# Patient Record
Sex: Female | Born: 1985 | Race: Black or African American | Hispanic: No | Marital: Single | Smoking: Never smoker
Health system: Southern US, Community
[De-identification: ages and names within clinical notes are randomized; demographics above are authoritative.]

---
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
## 2005-01-30 ENCOUNTER — Other Ambulatory Visit: Admission: RE | Admit: 2005-01-30 | Discharge: 2005-01-30 | Payer: Self-pay | Admitting: Internal Medicine

## 2007-08-19 ENCOUNTER — Emergency Department (HOSPITAL_COMMUNITY): Admission: EM | Admit: 2007-08-19 | Discharge: 2007-08-19 | Payer: Self-pay | Admitting: Emergency Medicine

## 2011-08-16 LAB — URINE MICROSCOPIC-ADD ON

## 2011-08-16 LAB — URINALYSIS, ROUTINE W REFLEX MICROSCOPIC
Bilirubin Urine: NEGATIVE
Glucose, UA: NEGATIVE
Hgb urine dipstick: NEGATIVE
Ketones, ur: NEGATIVE
pH: 7

## 2011-08-16 LAB — PREGNANCY, URINE: Preg Test, Ur: NEGATIVE

## 2015-08-18 ENCOUNTER — Emergency Department (HOSPITAL_BASED_OUTPATIENT_CLINIC_OR_DEPARTMENT_OTHER)
Admission: EM | Admit: 2015-08-18 | Discharge: 2015-08-18 | Disposition: A | Discharge status: Home / Self Care | Payer: No Typology Code available for payment source | Attending: Emergency Medicine | Admitting: Emergency Medicine

## 2015-08-18 ENCOUNTER — Emergency Department (HOSPITAL_BASED_OUTPATIENT_CLINIC_OR_DEPARTMENT_OTHER): Payer: No Typology Code available for payment source

## 2015-08-18 ENCOUNTER — Encounter (HOSPITAL_BASED_OUTPATIENT_CLINIC_OR_DEPARTMENT_OTHER): Payer: Self-pay | Admitting: *Deleted

## 2015-08-18 DIAGNOSIS — R51 Headache: Secondary | ICD-10-CM | POA: Diagnosis not present

## 2015-08-18 DIAGNOSIS — R519 Headache, unspecified: Secondary | ICD-10-CM

## 2015-08-18 MED ORDER — KETOROLAC TROMETHAMINE 60 MG/2ML IM SOLN
60.0000 mg | Freq: Once | INTRAMUSCULAR | Status: AC
  Administered 2015-08-18: 60 mg via INTRAMUSCULAR
  Filled 2015-08-18: qty 2

## 2015-08-18 NOTE — ED Provider Notes (Signed)
CSN: [id number]     Arrival date & time 08/18/15  0344 History   First MD Initiated Contact with Patient 08/18/15 0355     Chief Complaint  Patient presents with  . Headache     (Consider location/radiation/quality/duration/timing/severity/associated sxs/prior Treatment) HPI  This is a 29 year old female with a 6 week history of headaches. The headaches were initially mild and bilateral and resolved with ibuprofen or Aleve. That was some associated twitching of the left eye which is resolved. For the past 3 weeks the headaches have become more severe. They typically begin in the morning and last about 8 hours. They have usually resolved by bedtime but tonight the headache has persisted and is preventing her from sleeping. She rates the pain as severe. For the past several weeks the pain has been localized to the right side of the head, primarily behind the right eye. She denies any further eye twitching. She denies photophobia, blurred vision, numbness, weakness, nausea or vomiting.  History reviewed. No pertinent past medical history. History reviewed. No pertinent past surgical history. History reviewed. No pertinent family history. Social History  Substance Use Topics  . Smoking status: Never Smoker   . Smokeless tobacco: None  . Alcohol Use: No   OB History    No data available     Review of Systems  All other systems reviewed and are negative.   Allergies  Review of patient's allergies indicates no known allergies.  Home Medications   Prior to Admission medications   Not on File   BP 126/79 mmHg  Pulse 71  Temp(Src) 97.9 F (36.6 C) (Oral)  SpO2 100%  LMP 08/18/2015 (LMP Unknown)   Physical Exam  General: Well-developed, well-nourished female in no acute distress; appearance consistent with age of record HENT: normocephalic; atraumatic; scalp nontender Eyes: pupils equal, round and reactive to light; extraocular muscles intact Neck: supple Heart: regular rate  and rhythm Lungs: clear to auscultation bilaterally Abdomen: soft; nondistended; nontender Extremities: No deformity; full range of motion Neurologic: Awake, alert and oriented; motor function intact in all extremities and symmetric; no facial droop; normal coordination and speech; negative Romberg; normal finger to nose; normal gait Skin: Warm and dry Psychiatric: Normal mood and affect    ED Course  Procedures (including critical care time)   MDM  Nursing notes and vitals signs, including pulse oximetry, reviewed.  Summary of this visit's results, reviewed by myself:  Imaging Studies: Ct Head Wo Contrast  08/18/2015  CLINICAL DATA:  Increasing right frontal headaches for 3 weeks. No head injury. EXAM: CT HEAD WITHOUT CONTRAST TECHNIQUE: Contiguous axial images were obtained from the base of the skull through the vertex without intravenous contrast. COMPARISON:  None. FINDINGS: Ventricles and sulci appear symmetrical. No mass effect or midline shift. No abnormal extra-axial fluid collections. Gray-white matter junctions are distinct. Basal cisterns are not effaced. No evidence of acute intracranial hemorrhage. No depressed skull fractures. Visualized paranasal sinuses and mastoid air cells are not opacified. IMPRESSION: No acute intracranial abnormalities. Electronically Signed   By: Burman NievesWilliam  Stevens M.D.   On: 08/18/2015 04:32     5:02 AM Significant relief with IM Toradol.    Paula LibraJohn Emmakate Hypes, MD 08/18/15 [phone]

## 2015-08-18 NOTE — ED Notes (Signed)
Pt with intermittent HA x 6 weeks progressively worsening to the point that it has been continuous x 3 weeks.

## 2021-04-06 ENCOUNTER — Encounter (HOSPITAL_BASED_OUTPATIENT_CLINIC_OR_DEPARTMENT_OTHER): Payer: Self-pay | Admitting: Emergency Medicine

## 2021-04-06 ENCOUNTER — Emergency Department (HOSPITAL_BASED_OUTPATIENT_CLINIC_OR_DEPARTMENT_OTHER): Payer: 59

## 2021-04-06 ENCOUNTER — Other Ambulatory Visit: Payer: Self-pay

## 2021-04-06 ENCOUNTER — Emergency Department (HOSPITAL_BASED_OUTPATIENT_CLINIC_OR_DEPARTMENT_OTHER)
Admission: EM | Admit: 2021-04-06 | Discharge: 2021-04-06 | Disposition: A | Discharge status: Home / Self Care | Payer: 59 | Attending: Emergency Medicine | Admitting: Emergency Medicine

## 2021-04-06 DIAGNOSIS — Z20822 Contact with and (suspected) exposure to covid-19: Secondary | ICD-10-CM | POA: Insufficient documentation

## 2021-04-06 DIAGNOSIS — J189 Pneumonia, unspecified organism: Secondary | ICD-10-CM | POA: Insufficient documentation

## 2021-04-06 DIAGNOSIS — R0602 Shortness of breath: Secondary | ICD-10-CM | POA: Diagnosis present

## 2021-04-06 LAB — BRAIN NATRIURETIC PEPTIDE: B Natriuretic Peptide: 5.3 pg/mL (ref 0.0–100.0)

## 2021-04-06 LAB — COMPREHENSIVE METABOLIC PANEL
ALT: 129 U/L — ABNORMAL HIGH (ref 0–44)
AST: 80 U/L — ABNORMAL HIGH (ref 15–41)
Albumin: 3.6 g/dL (ref 3.5–5.0)
Alkaline Phosphatase: 71 U/L (ref 38–126)
Anion gap: 8 (ref 5–15)
BUN: 10 mg/dL (ref 6–20)
CO2: 26 mmol/L (ref 22–32)
Calcium: 8.7 mg/dL — ABNORMAL LOW (ref 8.9–10.3)
Chloride: 104 mmol/L (ref 98–111)
Creatinine, Ser: 0.73 mg/dL (ref 0.44–1.00)
GFR, Estimated: >60 mL/min (ref >60)
Glucose, Bld: 109 mg/dL — ABNORMAL HIGH (ref 70–99)
Potassium: 4 mmol/L (ref 3.5–5.1)
Sodium: 138 mmol/L (ref 135–145)
Total Bilirubin: 0.4 mg/dL (ref 0.3–1.2)
Total Protein: 7.6 g/dL (ref 6.5–8.1)

## 2021-04-06 LAB — D-DIMER, QUANTITATIVE: D-Dimer, Quant: 1.66 ug/mL-FEU — ABNORMAL HIGH (ref 0.00–0.50)

## 2021-04-06 LAB — RESP PANEL BY RT-PCR (FLU A&B, COVID) ARPGX2
Influenza A by PCR: NEGATIVE
Influenza B by PCR: NEGATIVE
SARS Coronavirus 2 by RT PCR: NEGATIVE

## 2021-04-06 LAB — TROPONIN I (HIGH SENSITIVITY): Troponin I (High Sensitivity): <2 ng/L (ref <18)

## 2021-04-06 LAB — PREGNANCY, URINE: Preg Test, Ur: NEGATIVE

## 2021-04-06 MED ORDER — AZITHROMYCIN 250 MG PO TABS: 250.0000 mg | ORAL_TABLET | Freq: Every day | ORAL | 0 refills | Status: AC

## 2021-04-06 MED ORDER — AZITHROMYCIN 250 MG PO TABS
500.0000 mg | ORAL_TABLET | Freq: Once | ORAL | Status: AC
  Administered 2021-04-06: 500 mg via ORAL
  Filled 2021-04-06: qty 2

## 2021-04-06 MED ORDER — IOHEXOL 350 MG/ML SOLN
100.0000 mL | Freq: Once | INTRAVENOUS | Status: AC | PRN
  Administered 2021-04-06: 75 mL via INTRAVENOUS

## 2021-04-06 MED ORDER — AMOXICILLIN-POT CLAVULANATE 875-125 MG PO TABS: 1.0000 | ORAL_TABLET | Freq: Two times a day (BID) | ORAL | 0 refills | Status: DC

## 2021-04-06 MED ORDER — ALBUTEROL SULFATE HFA 108 (90 BASE) MCG/ACT IN AERS
4.0000 | INHALATION_SPRAY | Freq: Once | RESPIRATORY_TRACT | Status: AC
  Administered 2021-04-06: 4 via RESPIRATORY_TRACT
  Filled 2021-04-06: qty 6.7

## 2021-04-06 MED ORDER — SODIUM CHLORIDE 0.9 % IV SOLN
2.0000 g | Freq: Once | INTRAVENOUS | Status: AC
  Administered 2021-04-06: 2 g via INTRAVENOUS
  Filled 2021-04-06: qty 20

## 2021-04-06 MED ORDER — IPRATROPIUM-ALBUTEROL 0.5-2.5 (3) MG/3ML IN SOLN
3.0000 mL | Freq: Once | RESPIRATORY_TRACT | Status: AC
  Administered 2021-04-06: 3 mL via RESPIRATORY_TRACT
  Filled 2021-04-06: qty 3

## 2021-04-06 MED ORDER — SODIUM CHLORIDE 0.9 % IV SOLN: INTRAVENOUS | Status: DC | PRN

## 2021-04-06 NOTE — ED Notes (Addendum)
Ambulated on r/a SpO2 96-100%, HR 105-110, RR 26-32. Endorses mild DOE.

## 2021-04-06 NOTE — ED Triage Notes (Signed)
Patient complains of shortness of breath x 1 week with worsening this am. Feels that she cannot get a full breath and is coughing.

## 2021-04-06 NOTE — ED Provider Notes (Signed)
[facility] EMERGENCY DEPARTMENT Provider Note   CSN: [id number] Arrival date & time: 04/06/21  0746     History No chief complaint on file.   Natasha Hardy is a 35 y.o. female.  35 year old female who presents with shortness of breath.  Patient states that she has had intermittent shortness of breath for the past 1 week that was initially when she was walking around or walking upstairs but this morning was present at rest.  She occasionally coughs when she tries to take a deep breath then and feels like she cannot take a full deep breath in but she denies persistent cough.  She had a viral URI about a month ago that completely resolved.  No fevers or current cold symptoms.  1 to 2 months ago, she was having swelling in her hands and feet and saw her PCP who referred her to a podiatrist and also to a rheumatologist.  She has not seen the rheumatologist yet but lab work was sent off including testing for lupus which was reportedly negative.  She denies any chest pain, orthopnea, PND, or leg swelling.  She had a long car ride yesterday but symptoms started prior to this.  She denies any history of blood clots, estrogen use, or family history of blood clots.  The history is provided by the patient.       History reviewed. No pertinent past medical history.  There are no problems to display for this patient.   History reviewed. No pertinent surgical history.   OB History   No obstetric history on file.     History reviewed. No pertinent family history.  Social History   Tobacco Use  . Smoking status: Never Smoker  . Smokeless tobacco: Never Used  Substance Use Topics  . Alcohol use: No  . Drug use: No    Home Medications Prior to Admission medications   Medication Sig Start Date End Date Taking? Authorizing Provider  amoxicillin-clavulanate (AUGMENTIN) 875-125 MG tablet Take 1 tablet by mouth every 12 (twelve) hours. 04/06/21  Yes Etan Vasudevan, Ambrose Finland, MD   azithromycin (ZITHROMAX) 250 MG tablet Take 1 tablet (250 mg total) by mouth daily for 4 days. 04/07/21 04/11/21 Yes Zyra Parrillo, Ambrose Finland, MD    Allergies    Patient has no known allergies.  Review of Systems   Review of Systems All other systems reviewed and are negative except that which was mentioned in HPI  Physical Exam Updated Vital Signs BP 104/80   Pulse 96   Temp 99 F (37.2 C) (Oral)   Resp (!) 21   Ht 5\' 2"  (1.575 m)   Wt 80.7 kg   LMP 04/03/2021 (Approximate)   SpO2 98%   BMI 32.56 kg/m   Physical Exam Constitutional:      General: She is not in acute distress.    Appearance: Normal appearance.  HENT:     Head: Normocephalic and atraumatic.  Eyes:     Conjunctiva/sclera: Conjunctivae normal.  Cardiovascular:     Rate and Rhythm: Normal rate and regular rhythm.     Heart sounds: Normal heart sounds. No murmur heard.   Pulmonary:     Effort: Pulmonary effort is normal.     Comments: Severely diminished BS b/l Abdominal:     General: Abdomen is flat. Bowel sounds are normal. There is no distension.     Palpations: Abdomen is soft.     Tenderness: There is no abdominal tenderness.  Musculoskeletal:     Right  lower leg: No edema.     Left lower leg: No edema.  Skin:    General: Skin is warm and dry.  Neurological:     Mental Status: She is alert and oriented to person, place, and time.     Comments: fluent  Psychiatric:        Mood and Affect: Mood normal.        Behavior: Behavior normal.     ED Results / Procedures / Treatments   Labs (all labs ordered are listed, but only abnormal results are displayed) Labs Reviewed  COMPREHENSIVE METABOLIC PANEL - Abnormal; Notable for the following components:      Result Value   Glucose, Bld 109 (*)    Calcium 8.7 (*)    AST 80 (*)    ALT 129 (*)    All other components within normal limits  D-DIMER, QUANTITATIVE (NOT AT [facility]) - Abnormal; Notable for the following components:   D-Dimer, Quant 1.66 (*)     All other components within normal limits  RESP PANEL BY RT-PCR (FLU A&B, COVID) ARPGX2  PREGNANCY, URINE  BRAIN NATRIURETIC PEPTIDE  TROPONIN I (HIGH SENSITIVITY)    EKG EKG Interpretation  Date/Time:  Thursday April 06 2021 08:00:05 EDT Ventricular Rate:  97 PR Interval:  109 QRS Duration: 75 QT Interval:  342 QTC Calculation: 435 R Axis:   67 Text Interpretation: Sinus rhythm Short PR interval Borderline T abnormalities, inferior leads No previous ECGs available Confirmed by Frederick Peers [phone]) on 04/06/2021 8:07:25 AM   Radiology DG Chest 2 View  Result Date: 04/06/2021 CLINICAL DATA:  Shortness of breath. EXAM: CHEST - 2 VIEW COMPARISON:  None. FINDINGS: Left basilar airspace opacities. Suspected small l bilateral pleural effusions. No discernible pneumothorax. Cardiomediastinal silhouette is within normal limits. No evidence of acute osseous abnormality. IMPRESSION: 1. Left basilar airspace opacities, concerning for pneumonia. 2. Suspected small bilateral pleural effusions. Electronically Signed   By: Feliberto Harts MD   On: 04/06/2021 08:48   CT Angio Chest PE W/Cm &/Or Wo Cm  Result Date: 04/06/2021 CLINICAL DATA:  35 year old female with positive D-dimer, possible pulmonary embolus EXAM: CT ANGIOGRAPHY CHEST WITH CONTRAST TECHNIQUE: Multidetector CT imaging of the chest was performed using the standard protocol during bolus administration of intravenous contrast. Multiplanar CT image reconstructions and MIPs were obtained to evaluate the vascular anatomy. CONTRAST:  7mL OMNIPAQUE IOHEXOL 350 MG/ML SOLN COMPARISON:  CTA chest 06/12/2017 FINDINGS: Cardiovascular: Satisfactory opacification of the pulmonary arteries to the segmental level. No evidence of pulmonary embolism. Normal heart size. No pericardial effusion. The right brachiocephalic and left common carotid arteries share a common origin. The left vertebral artery arises directly from the aorta. Mediastinum/Nodes:  Mildly prominent left hilar and prevascular station lymph nodes. The nodes remain within normal limits for size and are highly likely reactive. Lungs/Pleura: Multifocal regions of peripheral pleural based patchy airspace opacities involving both upper lungs, lower lungs and the inferior lingula. Consolidative changes are most conspicuous in the lower lungs. No pneumothorax or pleural effusion. No pulmonary edema. Upper Abdomen: No acute abnormality. Musculoskeletal: No acute fracture or aggressive appearing lytic or blastic osseous lesion. Review of the MIP images confirms the above findings. IMPRESSION: 1. Multifocal patchy airspace opacities in a predominantly peripheral and basilar distribution affecting both upper and lower lobes with relative sparing of the middle lobe. Differential considerations include multifocal pneumonia as well as an atypical/viral respiratory infection including COVID pneumonia. 2. Mildly prominent left hilar and mediastinal lymph nodes are  likely reactive in nature. Electronically Signed   By: Malachy Moan M.D.   On: 04/06/2021 11:09    Procedures Procedures   Medications Ordered in ED Medications  0.9 %  sodium chloride infusion (0 mLs Intravenous Stopped 04/06/21 1221)  ipratropium-albuterol (DUONEB) 0.5-2.5 (3) MG/3ML nebulizer solution 3 mL (3 mLs Nebulization Given 04/06/21 0823)  iohexol (OMNIPAQUE) 350 MG/ML injection 100 mL (75 mLs Intravenous Contrast Given 04/06/21 1044)  cefTRIAXone (ROCEPHIN) 2 g in sodium chloride 0.9 % 100 mL IVPB (0 g Intravenous Stopped 04/06/21 1221)  azithromycin (ZITHROMAX) tablet 500 mg (500 mg Oral Given 04/06/21 1130)  albuterol (VENTOLIN HFA) 108 (90 Base) MCG/ACT inhaler 4 puff (4 puffs Inhalation Given 04/06/21 1228)    ED Course  I have reviewed the triage vital signs and the nursing notes.  Pertinent labs & imaging results that were available during my care of the patient were reviewed by me and considered in my medical decision  making (see chart for details).    MDM Rules/Calculators/A&P                          Patient was severely diminished on exam but no obvious wheezing, O2 saturation 95% on room air, borderline tachycardic.  EKG shows sinus rhythm, no ischemic changes.  Differential includes bronchitis/asthma, PE, post viral pneumonia, or less likely heart failure.  She does not appear volume overloaded on exam.  Gave trial of DuoNeb which patient reports significantly improved her breathing. She is moving better air on reassessment, still no wheezing present. Will provide w/ inhaler.  Lab work notable for BNP normal, negative troponin, AST 80, ALT 129, D-dimer 1.66.  Chest x-ray with small bilateral pleural effusions and basilar opacities suggestive of pneumonia.  Because of elevated D-dimer and shortness of breath, obtain CTA to rule out PE.  CTA is negative for PE but does show multifocal pneumonia.  Her COVID-19 test was negative.  It is possible that she has developed a post viral pneumonia from previous URI illness a month ago.  Will treat with antibiotics.  She was able to ambulate here and maintain O2 saturations at 96% without severe dyspnea.  She has a pulse ox to use at home and discussed how to monitor symptoms.  Instructed to follow-up with PCP in a few days for reassessment and to have repeat chest x-ray in 3 to 4 weeks to ensure resolution.  I have extensively reviewed return precautions and she voiced understanding. Final Clinical Impression(s) / ED Diagnoses Final diagnoses:  Community acquired pneumonia, unspecified laterality    Rx / DC Orders ED Discharge Orders         Ordered    azithromycin (ZITHROMAX) 250 MG tablet  Daily        04/06/21 1241    amoxicillin-clavulanate (AUGMENTIN) 875-125 MG tablet  Every 12 hours        04/06/21 1241           [place] Kirchner, Ambrose Finland, MD 04/06/21 1531

## 2021-04-15 ENCOUNTER — Other Ambulatory Visit: Payer: Self-pay

## 2021-04-15 ENCOUNTER — Encounter (HOSPITAL_BASED_OUTPATIENT_CLINIC_OR_DEPARTMENT_OTHER): Payer: Self-pay | Admitting: Emergency Medicine

## 2021-04-15 ENCOUNTER — Emergency Department (HOSPITAL_BASED_OUTPATIENT_CLINIC_OR_DEPARTMENT_OTHER)
Admission: EM | Admit: 2021-04-15 | Discharge: 2021-04-16 | Disposition: A | Discharge status: Home / Self Care | Payer: 59 | Attending: Emergency Medicine | Admitting: Emergency Medicine

## 2021-04-15 DIAGNOSIS — R918 Other nonspecific abnormal finding of lung field: Secondary | ICD-10-CM | POA: Insufficient documentation

## 2021-04-15 DIAGNOSIS — R0602 Shortness of breath: Secondary | ICD-10-CM | POA: Diagnosis not present

## 2021-04-15 NOTE — ED Triage Notes (Signed)
Recently diagnosed with pneumonia.  Finished antibiotics two days ago.  Reports she's continuing to struggle to breathe and her chest feels achy.

## 2021-04-16 ENCOUNTER — Emergency Department (HOSPITAL_BASED_OUTPATIENT_CLINIC_OR_DEPARTMENT_OTHER): Payer: 59

## 2021-04-16 LAB — CBC WITH DIFFERENTIAL/PLATELET
Abs Immature Granulocytes: 0.02 10*3/uL (ref 0.00–0.07)
Basophils Absolute: 0 10*3/uL (ref 0.0–0.1)
Basophils Relative: 1 %
Eosinophils Absolute: 0 10*3/uL (ref 0.0–0.5)
Eosinophils Relative: 1 %
HCT: 37 % (ref 36.0–46.0)
Hemoglobin: 12.2 g/dL (ref 12.0–15.0)
Immature Granulocytes: 1 %
Lymphocytes Relative: 26 %
Lymphs Abs: 1 10*3/uL (ref 0.7–4.0)
MCH: 29.6 pg (ref 26.0–34.0)
MCHC: 33 g/dL (ref 30.0–36.0)
MCV: 89.8 fL (ref 80.0–100.0)
Monocytes Absolute: 0.3 10*3/uL (ref 0.1–1.0)
Monocytes Relative: 9 %
Neutro Abs: 2.6 10*3/uL (ref 1.7–7.7)
Neutrophils Relative %: 62 %
Platelets: 256 10*3/uL (ref 150–400)
RBC: 4.12 MIL/uL (ref 3.87–5.11)
RDW: 12.5 % (ref 11.5–15.5)
WBC: 4 10*3/uL (ref 4.0–10.5)
nRBC: 0 % (ref 0.0–0.2)

## 2021-04-16 LAB — URINALYSIS, ROUTINE W REFLEX MICROSCOPIC
Bilirubin Urine: NEGATIVE
Glucose, UA: NEGATIVE mg/dL
Hgb urine dipstick: NEGATIVE
Ketones, ur: NEGATIVE mg/dL
Leukocytes,Ua: NEGATIVE
Nitrite: NEGATIVE
Protein, ur: NEGATIVE mg/dL
Specific Gravity, Urine: 1.015 (ref 1.005–1.030)
pH: 7 (ref 5.0–8.0)

## 2021-04-16 LAB — COMPREHENSIVE METABOLIC PANEL
ALT: 61 U/L — ABNORMAL HIGH (ref 0–44)
AST: 43 U/L — ABNORMAL HIGH (ref 15–41)
Albumin: 3.5 g/dL (ref 3.5–5.0)
Alkaline Phosphatase: 74 U/L (ref 38–126)
Anion gap: 7 (ref 5–15)
BUN: 8 mg/dL (ref 6–20)
CO2: 27 mmol/L (ref 22–32)
Calcium: 8.7 mg/dL — ABNORMAL LOW (ref 8.9–10.3)
Chloride: 103 mmol/L (ref 98–111)
Creatinine, Ser: 0.61 mg/dL (ref 0.44–1.00)
GFR, Estimated: >60 mL/min (ref >60)
Glucose, Bld: 100 mg/dL — ABNORMAL HIGH (ref 70–99)
Potassium: 3.9 mmol/L (ref 3.5–5.1)
Sodium: 137 mmol/L (ref 135–145)
Total Bilirubin: 0.3 mg/dL (ref 0.3–1.2)
Total Protein: 7.5 g/dL (ref 6.5–8.1)

## 2021-04-16 MED ORDER — DOXYCYCLINE HYCLATE 100 MG PO TABS
100.0000 mg | ORAL_TABLET | Freq: Once | ORAL | Status: AC
  Administered 2021-04-16: 100 mg via ORAL
  Filled 2021-04-16: qty 1

## 2021-04-16 MED ORDER — DOXYCYCLINE HYCLATE 100 MG PO CAPS: 100.0000 mg | ORAL_CAPSULE | Freq: Two times a day (BID) | ORAL | 0 refills | Status: DC

## 2021-04-16 MED ORDER — ACETAMINOPHEN 500 MG PO TABS
1000.0000 mg | ORAL_TABLET | Freq: Once | ORAL | Status: AC
  Administered 2021-04-16: 1000 mg via ORAL
  Filled 2021-04-16: qty 2

## 2021-04-16 MED ORDER — SODIUM CHLORIDE 0.9 % IV BOLUS
1000.0000 mL | Freq: Once | INTRAVENOUS | Status: AC
  Administered 2021-04-16: 1000 mL via INTRAVENOUS

## 2021-04-16 NOTE — ED Notes (Signed)
Pt was ambulated SpO2 96-98

## 2021-04-16 NOTE — ED Provider Notes (Signed)
[facility] EMERGENCY DEPARTMENT Provider Note  CSN: [id number] Arrival date & time: 04/15/21 2315  Chief Complaint(s) Shortness of Breath  HPI Natasha Hardy is a 35 y.o. female who was diagnosed with multifocal pneumonia on June 2 and has completed her course of Augmentin and azithromycin here for recurrent shortness of breath. Patient reports feeling better while on antibiotics up until 2 days ago when she completed her antibiotic course. She then began having shortness of breath again with pleurisy. Mild cough. No fevers or chills. No nausea or vomiting. No abdominal pain  Symptoms started after a trip to [employer]. No wilderness activities. No tick or insect bites. No exposure to rat/mice or bird feces.   Has been dealing with swollen hands and feet. Following Rheum for work up.  Shortness of Breath  Past Medical History History reviewed. No pertinent past medical history. There are no problems to display for this patient.  Home Medication(s) Prior to Admission medications   Medication Sig Start Date End Date Taking? Authorizing Provider  doxycycline (VIBRAMYCIN) 100 MG capsule Take 1 capsule (100 mg total) by mouth 2 (two) times daily. 04/16/21  Yes Ilee Randleman, Amadeo Garnet, MD                                                                                                                                    Past Surgical History History reviewed. No pertinent surgical history. Family History No family history on file.  Social History Social History   Tobacco Use   Smoking status: Never   Smokeless tobacco: Never  Substance Use Topics   Alcohol use: No   Drug use: No   Allergies Patient has no known allergies.  Review of Systems Review of Systems  Respiratory:  Positive for shortness of breath.   All other systems are reviewed and are negative for acute change except as noted in the HPI  Physical Exam Vital Signs  I have reviewed the triage  vital signs BP (!) 99/49 (BP Location: Right Arm)   Pulse (!) 103   Temp 98.7 F (37.1 C) (Oral)   Resp 20   Ht 5\' 2"  (1.575 m)   Wt 80.7 kg   LMP 04/03/2021 (Approximate)   SpO2 98%   BMI 32.56 kg/m   Physical Exam Vitals reviewed.  Constitutional:      General: She is not in acute distress.    Appearance: She is well-developed. She is not diaphoretic.  HENT:     Head: Normocephalic and atraumatic.     Nose: Nose normal.  Eyes:     General: No scleral icterus.       Right eye: No discharge.        Left eye: No discharge.     Conjunctiva/sclera: Conjunctivae normal.     Pupils: Pupils are equal, round, and reactive to light.  Cardiovascular:     Rate and Rhythm: Normal rate and regular  rhythm.     Heart sounds: No murmur heard.   No friction rub. No gallop.  Pulmonary:     Effort: Pulmonary effort is normal. No respiratory distress.     Breath sounds: No stridor. Examination of the right-middle field reveals rales. Examination of the left-middle field reveals rales. Examination of the right-lower field reveals rales. Examination of the left-lower field reveals rales. Rales present.     Comments: Poor inhalation effort. Abdominal:     General: There is no distension.     Palpations: Abdomen is soft.     Tenderness: There is no abdominal tenderness.  Musculoskeletal:        General: No tenderness.     Cervical back: Normal range of motion and neck supple.  Skin:    General: Skin is warm and dry.     Findings: No erythema or rash.  Neurological:     Mental Status: She is alert and oriented to person, place, and time.    ED Results and Treatments Labs (all labs ordered are listed, but only abnormal results are displayed) Labs Reviewed  COMPREHENSIVE METABOLIC PANEL - Abnormal; Notable for the following components:      Result Value   Glucose, Bld 100 (*)    Calcium 8.7 (*)    AST 43 (*)    ALT 61 (*)    All other components within normal limits  CBC WITH  DIFFERENTIAL/PLATELET  URINALYSIS, ROUTINE W REFLEX MICROSCOPIC  ANGIOTENSIN CONVERTING ENZYME                                                                                                                         EKG  EKG Interpretation  Date/Time:  Saturday April 15 2021 23:29:53 EDT Ventricular Rate:  98 PR Interval:  118 QRS Duration: 76 QT Interval:  343 QTC Calculation: 438 R Axis:   76 Text Interpretation: Sinus rhythm Borderline short PR interval No acute changes Confirmed by Drema Pry [phone]) on 04/16/2021 12:46:51 AM        Radiology CT Chest Wo Contrast  Result Date: 04/16/2021 CLINICAL DATA:  Respiratory illness, nondiagnostic radiograph, shortness of breath for 1 week, worsening this morning EXAM: CT CHEST WITHOUT CONTRAST TECHNIQUE: Multidetector CT imaging of the chest was performed following the standard protocol without IV contrast. COMPARISON:  CT 04/06/2021, radiograph 04/16/2021 FINDINGS: Cardiovascular: Normal heart size. No pericardial effusion. No acute luminal abnormality of the imaged aorta. No periaortic stranding or hemorrhage. Normal 3 vessel branching of the aortic arch. Proximal great vessels are unremarkable. Central pulmonary arteries are normal caliber. Luminal evaluation of the vasculature precluded in the absence of contrast media. No major venous abnormalities are seen. Mediastinum/Nodes: Prominent prevascular, AP window and bilateral axillary lymph nodes are similar to prior and favored to be reactive. No pathologically enlarged nodes. No acute abnormality of the trachea or esophagus. Normal thyroid gland and thoracic inlet. Lungs/Pleura: Increasing patchy peripheral predominant areas of mixed consolidation and ground-glass opacity throughout both lungs,  left greater than right, with a slight basilar gradient. No pneumothorax or visible pleural effusion. Pulmonary vascularity remains normally distributed. No significant septal thickening or other CT  evidence pulmonary edema. Upper Abdomen: No acute abnormalities present in the visualized portions of the upper abdomen. Musculoskeletal: Multilevel degenerative changes are present in the imaged portions of the spine. No acute osseous abnormality or suspicious osseous lesion. No worrisome chest wall masses or lesions. IMPRESSION: 1. Increasing patchy peripheral predominant areas of mixed consolidation and ground-glass opacity throughout both lungs differential consideration would including multifocal pneumonia as well as other atypical infectious or inflammatory processes including COVID-19 pneumonia. 2. Prominent mediastinal and axillary adenopathy is favored to be reactive. Electronically Signed   By: Kreg Shropshire M.D.   On: 04/16/2021 02:57   DG Chest Port 1 View  Result Date: 04/16/2021 CLINICAL DATA:  Chest discomfort, shortness of breath EXAM: PORTABLE CHEST 1 VIEW COMPARISON:  04/06/2021 FINDINGS: Bibasilar atelectasis. Heart is normal size. No visible effusions. No acute bony abnormality. IMPRESSION: Bibasilar atelectasis. Electronically Signed   By: Charlett Nose M.D.   On: 04/16/2021 01:26    Pertinent labs & imaging results that were available during my care of the patient were reviewed by me and considered in my medical decision making (see chart for details).  Medications Ordered in ED Medications  acetaminophen (TYLENOL) tablet 1,000 mg (1,000 mg Oral Given 04/16/21 0052)  sodium chloride 0.9 % bolus 1,000 mL (0 mLs Intravenous Stopped 04/16/21 0238)  doxycycline (VIBRA-TABS) tablet 100 mg (100 mg Oral Given 04/16/21 0415)                                                                                                                                    Procedures Procedures  (including critical care time)  Medical Decision Making / ED Course I have reviewed the nursing notes for this encounter and the patient's prior records (if available in EHR or on provided paperwork).   Donasia Wimes was evaluated in Emergency Department on 04/16/2021 for the symptoms described in the history of present illness. She was evaluated in the context of the global COVID-19 pandemic, which necessitated consideration that the patient might be at risk for infection with the SARS-CoV-2 virus that causes COVID-19. Institutional protocols and algorithms that pertain to the evaluation of patients at risk for COVID-19 are in a state of rapid change based on information released by regulatory bodies including the CDC and federal and state organizations. These policies and algorithms were followed during the patient's care in the ED.  Work up not totally convincing for persistent CAP, but will continue treatment with Doxy in case. Given her extremity swelling, there's a concern for systemic inflammatory process. ACE sent to assess for Sarcoidosis.  No proteinuria that would be concerning for nephrotic syndrome. No hemoptysis or pulmonary hemorrhage poining to Wegner's or [place].  Patient also provided with incentive spirometer. Ambulated w/o desatting. Appropriate  for continued OP management.     Final Clinical Impression(s) / ED Diagnoses Final diagnoses:  Shortness of breath  Pulmonary infiltrate present on computed tomography   The patient appears reasonably screened and/or stabilized for discharge and I doubt any other medical condition or other [facility] requiring further screening, evaluation, or treatment in the ED at this time prior to discharge. Safe for discharge with strict return precautions.  Disposition: Discharge  Condition: Good  I have discussed the results, Dx and Tx plan with the patient/family who expressed understanding and agree(s) with the plan. Discharge instructions discussed at length. The patient/family was given strict return precautions who verbalized understanding of the instructions. No further questions at time of discharge.    ED Discharge Orders           Ordered    doxycycline (VIBRAMYCIN) 100 MG capsule  2 times daily        04/16/21 0435             Follow Up: Trey SailorsPa, [facility] [address] [place] [address] [phone]  Call  if symptoms do not improve or  worsen, in 5-7 days      This chart was dictated using voice recognition software.  Despite best efforts to proofread,  errors can occur which can change the documentation meaning.    Nira Connardama, Teran Daughenbaugh Eduardo, MD 04/16/21 [phone]

## 2021-04-16 NOTE — ED Notes (Signed)
Wants to consult w/ MD before getting cxr; says she just had one 3 days ago and was told she wouldn't need another for a month "b/c an xray lags behind the healing process."

## 2021-04-17 LAB — ANGIOTENSIN CONVERTING ENZYME: Angiotensin-Converting Enzyme: 41 U/L (ref 14–82)

## 2021-05-21 ENCOUNTER — Encounter (HOSPITAL_COMMUNITY): Payer: Self-pay | Admitting: Specialist

## 2021-05-21 ENCOUNTER — Inpatient Hospital Stay (HOSPITAL_COMMUNITY)
Admission: EM | Admit: 2021-05-21 | Discharge: 2021-05-28 | DRG: 208 | Disposition: A | Discharge status: Other Acute Inpatient Hospital | Payer: 59 | Attending: Internal Medicine | Admitting: Internal Medicine

## 2021-05-21 ENCOUNTER — Emergency Department (HOSPITAL_COMMUNITY): Payer: 59

## 2021-05-21 ENCOUNTER — Other Ambulatory Visit: Payer: Self-pay

## 2021-05-21 DIAGNOSIS — J982 Interstitial emphysema: Secondary | ICD-10-CM | POA: Diagnosis present

## 2021-05-21 DIAGNOSIS — Z20822 Contact with and (suspected) exposure to covid-19: Secondary | ICD-10-CM | POA: Diagnosis present

## 2021-05-21 DIAGNOSIS — J189 Pneumonia, unspecified organism: Secondary | ICD-10-CM | POA: Diagnosis present

## 2021-05-21 DIAGNOSIS — Z6830 Body mass index (BMI) 30.0-30.9, adult: Secondary | ICD-10-CM

## 2021-05-21 DIAGNOSIS — R21 Rash and other nonspecific skin eruption: Secondary | ICD-10-CM | POA: Diagnosis present

## 2021-05-21 DIAGNOSIS — K5900 Constipation, unspecified: Secondary | ICD-10-CM | POA: Diagnosis present

## 2021-05-21 DIAGNOSIS — L810 Postinflammatory hyperpigmentation: Secondary | ICD-10-CM | POA: Diagnosis present

## 2021-05-21 DIAGNOSIS — Q211 Atrial septal defect: Secondary | ICD-10-CM

## 2021-05-21 DIAGNOSIS — J9690 Respiratory failure, unspecified, unspecified whether with hypoxia or hypercapnia: Secondary | ICD-10-CM | POA: Diagnosis present

## 2021-05-21 DIAGNOSIS — J849 Interstitial pulmonary disease, unspecified: Secondary | ICD-10-CM | POA: Diagnosis present

## 2021-05-21 DIAGNOSIS — F064 Anxiety disorder due to known physiological condition: Secondary | ICD-10-CM | POA: Diagnosis present

## 2021-05-21 DIAGNOSIS — E43 Unspecified severe protein-calorie malnutrition: Secondary | ICD-10-CM | POA: Diagnosis present

## 2021-05-21 DIAGNOSIS — T380X5A Adverse effect of glucocorticoids and synthetic analogues, initial encounter: Secondary | ICD-10-CM | POA: Diagnosis present

## 2021-05-21 DIAGNOSIS — Z978 Presence of other specified devices: Secondary | ICD-10-CM

## 2021-05-21 DIAGNOSIS — Z79899 Other long term (current) drug therapy: Secondary | ICD-10-CM | POA: Diagnosis not present

## 2021-05-21 DIAGNOSIS — Z7952 Long term (current) use of systemic steroids: Secondary | ICD-10-CM | POA: Diagnosis not present

## 2021-05-21 DIAGNOSIS — J9601 Acute respiratory failure with hypoxia: Secondary | ICD-10-CM | POA: Diagnosis present

## 2021-05-21 DIAGNOSIS — R55 Syncope and collapse: Secondary | ICD-10-CM | POA: Diagnosis not present

## 2021-05-21 DIAGNOSIS — D849 Immunodeficiency, unspecified: Secondary | ICD-10-CM | POA: Diagnosis not present

## 2021-05-21 DIAGNOSIS — Z8616 Personal history of COVID-19: Secondary | ICD-10-CM

## 2021-05-21 DIAGNOSIS — H5702 Anisocoria: Secondary | ICD-10-CM | POA: Diagnosis present

## 2021-05-21 DIAGNOSIS — J80 Acute respiratory distress syndrome: Secondary | ICD-10-CM | POA: Diagnosis not present

## 2021-05-21 DIAGNOSIS — M0510 Rheumatoid lung disease with rheumatoid arthritis of unspecified site: Secondary | ICD-10-CM | POA: Diagnosis present

## 2021-05-21 DIAGNOSIS — D84821 Immunodeficiency due to drugs: Secondary | ICD-10-CM | POA: Diagnosis not present

## 2021-05-21 DIAGNOSIS — R739 Hyperglycemia, unspecified: Secondary | ICD-10-CM | POA: Diagnosis present

## 2021-05-21 DIAGNOSIS — Z452 Encounter for adjustment and management of vascular access device: Secondary | ICD-10-CM

## 2021-05-21 DIAGNOSIS — F419 Anxiety disorder, unspecified: Secondary | ICD-10-CM | POA: Diagnosis present

## 2021-05-21 DIAGNOSIS — R7401 Elevation of levels of liver transaminase levels: Secondary | ICD-10-CM

## 2021-05-21 LAB — I-STAT ARTERIAL BLOOD GAS, ED
Acid-Base Excess: 1 mmol/L (ref 0.0–2.0)
Bicarbonate: 25.9 mmol/L (ref 20.0–28.0)
Calcium, Ion: 1.31 mmol/L (ref 1.15–1.40)
HCT: 50 % — ABNORMAL HIGH (ref 36.0–46.0)
Hemoglobin: 17 g/dL — ABNORMAL HIGH (ref 12.0–15.0)
O2 Saturation: 92 %
Potassium: 4.1 mmol/L (ref 3.5–5.1)
Sodium: 141 mmol/L (ref 135–145)
TCO2: 27 mmol/L (ref 22–32)
pCO2 arterial: 40.2 mmHg (ref 32.0–48.0)
pH, Arterial: 7.416 (ref 7.350–7.450)
pO2, Arterial: 63 mmHg — ABNORMAL LOW (ref 83.0–108.0)

## 2021-05-21 LAB — COMPREHENSIVE METABOLIC PANEL
ALT: 78 U/L — ABNORMAL HIGH (ref 0–44)
AST: 60 U/L — ABNORMAL HIGH (ref 15–41)
Albumin: 3.2 g/dL — ABNORMAL LOW (ref 3.5–5.0)
Alkaline Phosphatase: 61 U/L (ref 38–126)
Anion gap: 9 (ref 5–15)
BUN: 6 mg/dL (ref 6–20)
CO2: 25 mmol/L (ref 22–32)
Calcium: 9.2 mg/dL (ref 8.9–10.3)
Chloride: 104 mmol/L (ref 98–111)
Creatinine, Ser: 0.57 mg/dL (ref 0.44–1.00)
GFR, Estimated: >60 mL/min (ref >60)
Glucose, Bld: 141 mg/dL — ABNORMAL HIGH (ref 70–99)
Potassium: 4.2 mmol/L (ref 3.5–5.1)
Sodium: 138 mmol/L (ref 135–145)
Total Bilirubin: 0.5 mg/dL (ref 0.3–1.2)
Total Protein: 8 g/dL (ref 6.5–8.1)

## 2021-05-21 LAB — CBC
HCT: 43 % (ref 36.0–46.0)
HCT: 41.4 % (ref 36.0–46.0)
Hemoglobin: 13.7 g/dL (ref 12.0–15.0)
Hemoglobin: 13.8 g/dL (ref 12.0–15.0)
MCH: 28.8 pg (ref 26.0–34.0)
MCH: 29.4 pg (ref 26.0–34.0)
MCHC: 31.9 g/dL (ref 30.0–36.0)
MCHC: 33.3 g/dL (ref 30.0–36.0)
MCV: 90.3 fL (ref 80.0–100.0)
MCV: 88.1 fL (ref 80.0–100.0)
Platelets: 315 10*3/uL (ref 150–400)
Platelets: 307 10*3/uL (ref 150–400)
RBC: 4.76 MIL/uL (ref 3.87–5.11)
RBC: 4.7 MIL/uL (ref 3.87–5.11)
RDW: 12.9 % (ref 11.5–15.5)
RDW: 12.8 % (ref 11.5–15.5)
WBC: 8.7 10*3/uL (ref 4.0–10.5)
WBC: 14 10*3/uL — ABNORMAL HIGH (ref 4.0–10.5)
nRBC: 0 % (ref 0.0–0.2)
nRBC: 0 % (ref 0.0–0.2)

## 2021-05-21 LAB — CBC WITH DIFFERENTIAL/PLATELET
Abs Immature Granulocytes: 0.08 10*3/uL — ABNORMAL HIGH (ref 0.00–0.07)
Basophils Absolute: 0 10*3/uL (ref 0.0–0.1)
Basophils Relative: 0 %
Eosinophils Absolute: 0 10*3/uL (ref 0.0–0.5)
Eosinophils Relative: 0 %
HCT: 43.7 % (ref 36.0–46.0)
Hemoglobin: 14.1 g/dL (ref 12.0–15.0)
Immature Granulocytes: 1 %
Lymphocytes Relative: 10 %
Lymphs Abs: 0.8 10*3/uL (ref 0.7–4.0)
MCH: 28.8 pg (ref 26.0–34.0)
MCHC: 32.3 g/dL (ref 30.0–36.0)
MCV: 89.4 fL (ref 80.0–100.0)
Monocytes Absolute: 0.3 10*3/uL (ref 0.1–1.0)
Monocytes Relative: 5 %
Neutro Abs: 6.2 10*3/uL (ref 1.7–7.7)
Neutrophils Relative %: 84 %
Platelets: 338 10*3/uL (ref 150–400)
RBC: 4.89 MIL/uL (ref 3.87–5.11)
RDW: 13 % (ref 11.5–15.5)
WBC: 7.4 10*3/uL (ref 4.0–10.5)
nRBC: 0 % (ref 0.0–0.2)

## 2021-05-21 LAB — TROPONIN I (HIGH SENSITIVITY)
Troponin I (High Sensitivity): 5 ng/L (ref <18)
Troponin I (High Sensitivity): 5 ng/L (ref <18)

## 2021-05-21 LAB — BRAIN NATRIURETIC PEPTIDE: B Natriuretic Peptide: 6 pg/mL (ref 0.0–100.0)

## 2021-05-21 LAB — GLUCOSE, CAPILLARY: Glucose-Capillary: 137 mg/dL — ABNORMAL HIGH (ref 70–99)

## 2021-05-21 LAB — RESP PANEL BY RT-PCR (FLU A&B, COVID) ARPGX2
Influenza A by PCR: NEGATIVE
Influenza B by PCR: NEGATIVE
SARS Coronavirus 2 by RT PCR: NEGATIVE

## 2021-05-21 LAB — CREATININE, SERUM
Creatinine, Ser: 0.46 mg/dL (ref 0.44–1.00)
GFR, Estimated: >60 mL/min (ref >60)

## 2021-05-21 LAB — HIV ANTIBODY (ROUTINE TESTING W REFLEX): HIV Screen 4th Generation wRfx: NONREACTIVE

## 2021-05-21 MED ORDER — PIPERACILLIN-TAZOBACTAM 3.375 G IVPB
3.3750 g | Freq: Three times a day (TID) | INTRAVENOUS | Status: DC
  Administered 2021-05-21 – 2021-05-23 (×5): 3.375 g via INTRAVENOUS
  Filled 2021-05-21 (×5): qty 50

## 2021-05-21 MED ORDER — DOCUSATE SODIUM 100 MG PO CAPS: 100.0000 mg | ORAL_CAPSULE | Freq: Two times a day (BID) | ORAL | Status: DC | PRN

## 2021-05-21 MED ORDER — METHYLPREDNISOLONE SODIUM SUCC 125 MG IJ SOLR
60.0000 mg | Freq: Four times a day (QID) | INTRAMUSCULAR | Status: DC
  Administered 2021-05-21 – 2021-05-24 (×11): 60 mg via INTRAVENOUS
  Filled 2021-05-21 (×11): qty 2

## 2021-05-21 MED ORDER — METHYLPREDNISOLONE SODIUM SUCC 125 MG IJ SOLR
125.0000 mg | Freq: Once | INTRAMUSCULAR | Status: AC
  Administered 2021-05-21: 125 mg via INTRAVENOUS
  Filled 2021-05-21: qty 2

## 2021-05-21 MED ORDER — METHYLPREDNISOLONE SODIUM SUCC 125 MG IJ SOLR: 80.0000 mg | Freq: Every day | INTRAMUSCULAR | Status: DC

## 2021-05-21 MED ORDER — SODIUM CHLORIDE 0.9 % IV SOLN
500.0000 mg | INTRAVENOUS | Status: DC
  Administered 2021-05-21 – 2021-05-22 (×2): 500 mg via INTRAVENOUS
  Filled 2021-05-21 (×3): qty 500

## 2021-05-21 MED ORDER — ORAL CARE MOUTH RINSE
15.0000 mL | Freq: Two times a day (BID) | OROMUCOSAL | Status: DC
  Administered 2021-05-24 – 2021-05-27 (×3): 15 mL via OROMUCOSAL

## 2021-05-21 MED ORDER — IPRATROPIUM-ALBUTEROL 0.5-2.5 (3) MG/3ML IN SOLN
3.0000 mL | Freq: Once | RESPIRATORY_TRACT | Status: AC
  Administered 2021-05-21: 3 mL via RESPIRATORY_TRACT
  Filled 2021-05-21: qty 3

## 2021-05-21 MED ORDER — POLYETHYLENE GLYCOL 3350 17 G PO PACK: 17.0000 g | PACK | Freq: Every day | ORAL | Status: DC | PRN

## 2021-05-21 MED ORDER — FAMOTIDINE IN NACL 20-0.9 MG/50ML-% IV SOLN
20.0000 mg | Freq: Two times a day (BID) | INTRAVENOUS | Status: DC
  Administered 2021-05-21 – 2021-05-22 (×3): 20 mg via INTRAVENOUS
  Filled 2021-05-21 (×3): qty 50

## 2021-05-21 MED ORDER — CHLORHEXIDINE GLUCONATE CLOTH 2 % EX PADS
6.0000 | MEDICATED_PAD | Freq: Every day | CUTANEOUS | Status: DC
  Administered 2021-05-21 – 2021-05-27 (×6): 6 via TOPICAL

## 2021-05-21 MED ORDER — ALBUTEROL SULFATE (2.5 MG/3ML) 0.083% IN NEBU
2.5000 mg | INHALATION_SOLUTION | RESPIRATORY_TRACT | Status: DC
  Administered 2021-05-21 – 2021-05-22 (×5): 2.5 mg via RESPIRATORY_TRACT
  Filled 2021-05-21 (×4): qty 3

## 2021-05-21 MED ORDER — ENOXAPARIN SODIUM 40 MG/0.4ML IJ SOSY
40.0000 mg | PREFILLED_SYRINGE | INTRAMUSCULAR | Status: DC
  Administered 2021-05-22 – 2021-05-26 (×5): 40 mg via SUBCUTANEOUS
  Filled 2021-05-21 (×5): qty 0.4

## 2021-05-21 MED ORDER — CHLORHEXIDINE GLUCONATE 0.12 % MT SOLN
15.0000 mL | Freq: Two times a day (BID) | OROMUCOSAL | Status: DC
  Administered 2021-05-22 – 2021-05-27 (×10): 15 mL via OROMUCOSAL
  Filled 2021-05-21 (×9): qty 15

## 2021-05-21 MED ORDER — IOHEXOL 350 MG/ML SOLN
100.0000 mL | Freq: Once | INTRAVENOUS | Status: AC | PRN
  Administered 2021-05-21: 100 mL via INTRAVENOUS

## 2021-05-21 MED ORDER — ACETAMINOPHEN 650 MG RE SUPP: 650.0000 mg | Freq: Four times a day (QID) | RECTAL | Status: DC | PRN

## 2021-05-21 MED ORDER — IPRATROPIUM-ALBUTEROL 0.5-2.5 (3) MG/3ML IN SOLN
3.0000 mL | Freq: Four times a day (QID) | RESPIRATORY_TRACT | Status: DC | PRN
  Administered 2021-05-21: 3 mL via RESPIRATORY_TRACT
  Filled 2021-05-21: qty 3

## 2021-05-21 MED ORDER — ONDANSETRON HCL 4 MG/2ML IJ SOLN: 4.0000 mg | Freq: Four times a day (QID) | INTRAMUSCULAR | Status: DC | PRN

## 2021-05-21 MED ORDER — LORAZEPAM 2 MG/ML IJ SOLN
0.5000 mg | Freq: Once | INTRAMUSCULAR | Status: AC
  Administered 2021-05-21: 0.5 mg via INTRAVENOUS
  Filled 2021-05-21: qty 1

## 2021-05-21 MED ORDER — ACETAMINOPHEN 325 MG PO TABS
650.0000 mg | ORAL_TABLET | Freq: Four times a day (QID) | ORAL | Status: DC | PRN
  Administered 2021-05-24 – 2021-05-27 (×4): 650 mg via ORAL
  Filled 2021-05-21 (×4): qty 2

## 2021-05-21 NOTE — Progress Notes (Signed)
Pt transported from ED32 to 2H-24 without event.

## 2021-05-21 NOTE — ED Notes (Signed)
Critical care providers bedside along w/ respiratory speaking to pt. And mother.

## 2021-05-21 NOTE — ED Provider Notes (Signed)
Signout note  35 year old lady with history of recent hospitalization for pneumonia presenting to ER with concern for worsening shortness of breath, low oxygen saturation.  Currently on 6 L nasal cannula, not in distress and alert.  7:05 AM Received signout from Dr. Judd Lien, plan to follow-up on CT scan and admit patient  CT shows extensive pneumomediastinum.  Discussed with pulmonology who will see patient as consult.  They recommended discussing with cardiothoracic surgery.  Discussed with Dr. Cliffton Asters.  Their team will also evaluate patient.  Recommend getting esophagram for completeness sake however this is suspected to be more likely barotrauma from frequent cough and recent respiratory illnesses.  On frequent reassessments, patient condition remains stable.  She is somewhat tachypneic but not in frank distress. However does have significant oxygen requirement, now on high flow nasal cannula. PCCM evaluated patient and feels okay for admission to medicine at present. Consulted unassigned med admit.  CRITICAL CARE Performed by: Milagros Loll   Total critical care time: 38 minutes  Critical care time was exclusive of separately billable procedures and treating other patients.  Critical care was necessary to treat or prevent imminent or life-threatening deterioration.  Critical care was time spent personally by me on the following activities: development of treatment plan with patient and/or surrogate as well as nursing, discussions with consultants, evaluation of patient's response to treatment, examination of patient, obtaining history from patient or surrogate, ordering and performing treatments and interventions, ordering and review of laboratory studies, ordering and review of radiographic studies, pulse oximetry and re-evaluation of patient's condition.    Milagros Loll, MD 05/22/21 1141

## 2021-05-21 NOTE — Consult Note (Signed)
NAME:  Natasha Hardy, MRN:  [id number], DOB:  [birth year], LOS: 0 ADMISSION DATE:  05/21/2021, CONSULTATION DATE: 05/21/2021 REFERRING MD: Dr. Stevie Kern, I M[facility], CHIEF COMPLAINT: Abnormal CT chest, pneumomediastinum  History of Present Illness:  35 year old woman, never smoker, with past medical history.  She began to have edema, joint pain as early as April 2022.  Evaluated for progressive dyspnea June 2 and found to have abnormal CT chest with bilateral patchy groundglass pulmonary infiltrates with a relative peripheral distribution including the apices and both bases which were more consolidated.  Her COVID-19 was negative.  She was treated with antibiotics without significant improvement and was reevaluated 04/16/2021.  Another CT chest showed progression of bilateral patchy groundglass infiltrates again in a peripheral pattern with a slight basilar gradient, associated with prominent mediastinal nodes.  She was again treated unsuccessfully with antibiotics.  She was admitted in [facility] 6/20 through 6/24, repeat CT there again showed persistent patchy peripheral consolidation and groundglass with progression.  Her COVID 19 testing was negative on each occasion.  She received empiric IV antibiotics as well as IV corticosteroids, transitioned to prednisone and tapered.  Presents now with progressive exertional SOB, paroxysmal cough that is mostly non-productive. No N/V or dry heaves.   No eosinophilia on CBC from 04/16/21, repeat pending  Pertinent  Medical History  Gestational diabetes Iron deficiency anemia LE arthralgias and swelling, associated with rash, distal digital petechiae - negative Rheum eval per her report.    Significant Hospital Events: Including procedures, antibiotic start and stop dates in addition to other pertinent events   Multiple CT scans of the chest since 04/06/2021 as an outpatient and then when admitted in [facility] with bilateral pulmonary  infiltrates Echocardiogram (HP) 04/26/21 >> LVEF 65-70%, normal LV size, normal RV size and function  Interim History / Subjective:  Continues have some dyspnea and has had desaturations with coughing, conversation, movement  Objective   Blood pressure 125/89, pulse (!) 114, temperature 98.5 F (36.9 C), temperature source Oral, resp. rate (!) 22, height 5\' 2"  (1.575 m), weight 76.7 kg, SpO2 100 %.       No intake or output data in the 24 hours ending 05/21/21 1315 Filed Weights   05/21/21 0445  Weight: 76.7 kg    Examination: General: Well-developed comfortable woman laying in bed on supplemental oxygen HENT: Oropharynx clear, moist, pupils equal Lungs: Very distant, few bibasilar inspiratory crackles, no wheezes Cardiovascular: Regular, distant, no murmur Abdomen: Nondistended, positive bowel sounds Extremities: No significant edema, no joint swelling in the toes.  Question some slight swelling in the bilateral PIP joints of her hands Neuro: Awake, alert, interacting appropriately, well oriented  Resolved Hospital Problem list     Assessment & Plan:   Acute hypoxemic respiratory failure due to bilateral pulmonary infiltrates.  These have a somewhat peripheral predominance, affect the apices and more so both bases.  Differential diagnosis includes cryptogenic organizing pneumonia (possibly following viral syndrome for her original presentation in early June), eosinophilic granulomatosis or Churg-Strauss, sarcoidosis. Consider autoimmune mediated interstitial infiltrates given her joint discomfort, swelling, rash although initial rheumatology labs were reportedly unrevealing.  Consider also opportunistic or atypical infection.  HIV is pending.  She does not have any significant inhaled exposures, occupational or environmental exposures. -Follow HIV -Plan to resend autoimmune panel -Hypersensitive pneumonitis panel -Angiotensin converting enzyme level -CBC with differential and IgE  to evaluate for possible allergic mediated process, Churg-Strauss -She likely benefit from bronchoscopy with transbronchial biopsies, BAL for cell count  and differential, cytology, culture data.  At this point given her high oxygen need there would be significant risk for respiratory failure, mechanical ventilation which we should try to avoid.  Ultimately in order to get answers we may need to reconsider bronchoscopy or even VATS biopsy with thoracic surgery. -Agree with corticosteroids to empirically treat possible cryptogenic organizing pneumonia (COP). -Low threshold to discontinue her antibiotics.  Could check a procalcitonin to help determine duration.  Pneumomediastinum.  Likely due to paroxysmal hard coughing.  No evidence of pneumothorax on chest CT -No intervention at this time -Agree w esophagram for completeness to ensure no esophageal defect -Follow CXR and clinical status, watching for any evidence of pneumothorax that would require chest tube placement   Labs   CBC: Recent Labs  Lab 05/21/21 0503  WBC 8.7  HGB 13.7  HCT 43.0  MCV 90.3  PLT 315    Basic Metabolic Panel: Recent Labs  Lab 05/21/21 0503  NA 138  K 4.2  CL 104  CO2 25  GLUCOSE 141*  BUN 6  CREATININE 0.57  CALCIUM 9.2   GFR: Estimated Creatinine Clearance: 94.1 mL/min (by C-G formula based on SCr of 0.57 mg/dL). Recent Labs  Lab 05/21/21 0503  WBC 8.7    Liver Function Tests: Recent Labs  Lab 05/21/21 0503  AST 60*  ALT 78*  ALKPHOS 61  BILITOT 0.5  PROT 8.0  ALBUMIN 3.2*   No results for input(s): LIPASE, AMYLASE in the last 168 hours. No results for input(s): AMMONIA in the last 168 hours.  ABG No results found for: PHART, PCO2ART, PO2ART, HCO3, TCO2, ACIDBASEDEF, O2SAT   Coagulation Profile: No results for input(s): INR, PROTIME in the last 168 hours.  Cardiac Enzymes: No results for input(s): CKTOTAL, CKMB, CKMBINDEX, TROPONINI in the last 168 hours.  HbA1C: No results  found for: HGBA1C  CBG: No results for input(s): GLUCAP in the last 168 hours.  Review of Systems:   Dyspnea, cough.  Nausea, diarrhea, tightness and swelling in her lower extremities and joint pain.  No hemoptysis, fever, chills, chest discomfort. She did have a rash prior to her previous hospitalization on her chest.  Past Medical History:  She,  has no past medical history on file.   Surgical History:  No past surgical history on file.   Social History:   reports that she has never smoked. She has never used smokeless tobacco. She reports that she does not drink alcohol and does not use drugs.  No inhaled medications or drugs of any kind prior to this illness.  She has been given albuterol HFA and has used intermittently No occupational inhaled exposures, works in [employer], [employer] No pets/birds, no hot tub/pool, no mold exposure, no travel or [employer] [place] native  Family History:  Diabetes mellitus Breast and ovarian cancer Colon cancer  Allergies No Known Allergies   Home Medications  Prior to Admission medications   Medication Sig Start Date End Date Taking? Authorizing Provider  doxycycline (VIBRAMYCIN) 100 MG capsule Take 1 capsule (100 mg total) by mouth 2 (two) times daily. 04/16/21   Nira Conn, MD     Critical care time: N/A     Levy Pupa, MD, PhD 05/21/2021, 3:06 PM [facility] [phone] or if no answer before 7:00PM call [phone] For any issues after 7:00PM please call eLink [phone]

## 2021-05-21 NOTE — Progress Notes (Signed)
  Came to reassess given that patient was placed on BiPAP, concern for declining airway.   Recent ABG on salter HFNC/ neb tx showing 7.416/ 40/ 63/ 25.9  Patient alert on BiPAP, mother at bedside.   Currently on BiPAP 10/5 with great TV 380-450, on 100% with sats up to 95, but still tachypneic- 30-50s;  lungs clear / diminished.  S/p steroids earlier.  Patient reports no change in her SOB unchanged.  Reports she has not slept and mostly anxious.  Not on anti-anxiety meds at home. Steroids may be contributing.  P:  - Will trial ativan 0.5mg  to help her relax.  Not worried about hypercarbia at this point, mainly oxygenation is her issue, however acceptable at this time.  - we discuss the possibility of switching to HHFNC (not salter HFNC) for her comfort/ as she wants her mouth swabbed, so she can talk, but as of right now, wants to continue BiPAP.  - will have night team reassess soon.       Posey Boyer, ACNP [facility] 05/21/2021, 6:47 PM  See Amion for pager If no response to pager, please call PCCM consult pager After 7:00 pm call Elink

## 2021-05-21 NOTE — ED Provider Notes (Signed)
[facility] EMERGENCY DEPARTMENT Provider Note   CSN: [id number] Arrival date & time: 05/21/21  0418     History Chief Complaint  Patient presents with   Shortness of Breath    Pt arrived POV for shOB. Per pt she has been feeling short of breath since June 2nd where she was diagnosed with PNA. Denies chest pain.      Natasha Hardy is a 35 y.o. female.  Patient is a 35 year old female previously healthy presenting with complaints of shortness of breath.  Patient originally began with cough and congestion the beginning of June upon returning from a vacation in [place] of I believe [place].  She had been treated with multiple rounds of antibiotics, however symptoms did not improve.  She was admitted at [facility] for 4 days during which time she underwent multiple tests, however nothing revealed a definitive cause.  The possibility of autoimmune disease and possibly restrictive lung disease were considered.  Patient presents here with worsening shortness of breath over the past few days.  She states that she is having difficulty even walking across the room.  Patient arrives here with oxygen saturations of 87% on room air.  The history is provided by the patient.  Shortness of Breath     No past medical history on file.  There are no problems to display for this patient.   No past surgical history on file.   OB History   No obstetric history on file.     No family history on file.  Social History   Tobacco Use   Smoking status: Never   Smokeless tobacco: Never  Substance Use Topics   Alcohol use: No   Drug use: No    Home Medications Prior to Admission medications   Medication Sig Start Date End Date Taking? Authorizing Provider  doxycycline (VIBRAMYCIN) 100 MG capsule Take 1 capsule (100 mg total) by mouth 2 (two) times daily. 04/16/21   Nira Conn, MD    Allergies    Patient has no known allergies.  Review  of Systems   Review of Systems  Respiratory:  Positive for shortness of breath.   All other systems reviewed and are negative.  Physical Exam Updated Vital Signs BP 117/75   Pulse (!) 116   Temp 98.5 F (36.9 C) (Oral)   Resp 14   Ht 5\' 2"  (1.575 m)   Wt 76.7 kg   SpO2 94%   BMI 30.91 kg/m   Physical Exam Vitals and nursing note reviewed.  Constitutional:      General: She is not in acute distress.    Appearance: She is well-developed. She is not diaphoretic.  HENT:     Head: Normocephalic and atraumatic.  Cardiovascular:     Rate and Rhythm: Normal rate and regular rhythm.     Heart sounds: No murmur heard.   No friction rub. No gallop.  Pulmonary:     Effort: Pulmonary effort is normal. No respiratory distress.     Breath sounds: Normal breath sounds. No wheezing or rales.  Abdominal:     General: Bowel sounds are normal. There is no distension.     Palpations: Abdomen is soft.     Tenderness: There is no abdominal tenderness.  Musculoskeletal:        General: Normal range of motion.     Cervical back: Normal range of motion and neck supple.  Skin:    General: Skin is warm and dry.  Neurological:     General: No focal deficit present.     Mental Status: She is alert and oriented to person, place, and time.    ED Results / Procedures / Treatments   Labs (all labs ordered are listed, but only abnormal results are displayed) Labs Reviewed  RESP PANEL BY RT-PCR (FLU A&B, COVID) [id number]  COMPREHENSIVE METABOLIC PANEL  CBC  BRAIN NATRIURETIC PEPTIDE  TROPONIN I (HIGH SENSITIVITY)    EKG EKG Interpretation  Date/Time:  Sunday May 21 2021 05:01:13 EDT Ventricular Rate:  120 PR Interval:  107 QRS Duration: 71 QT Interval:  306 QTC Calculation: 433 R Axis:   55 Text Interpretation: Sinus tachycardia Borderline T abnormalities, diffuse leads Confirmed by Geoffery Lyons ([id number]) on 05/21/2021 5:25:12 AM  Radiology No results found.  Procedures Procedures    Medications Ordered in ED Medications - No data to display  ED Course  I have reviewed the triage vital signs and the nursing notes.  Pertinent labs & imaging results that were available during my care of the patient were reviewed by me and considered in my medical decision making (see chart for details).    MDM Rules/Calculators/A&P  Patient is a 35 year old female previously healthy presenting with shortness of breath.  Patient has been experiencing pneumonia like symptoms since the beginning of June.  She was hospitalized at [facility] and was diagnosed with possible autoimmune versus restrictive lung disease.  Patient's breathing worsened this evening and presents for evaluation of this.  Patient arrives here with oxygen saturations in the upper 80s on room air.  She was placed on supplemental oxygen at 6 L nasal cannula to maintain saturations into the 90s.  Work-up initiated including laboratory studies and chest x-ray.  These have returned and are essentially unremarkable with the exception of persistent bilateral multilobar pneumonia.  While in the emergency department, patient's oxygen saturations declined while on nasal cannula hand will be switched to high flow oxygen.  To evaluate for the cause of her declining saturations, a CT scan of the chest will be obtained to rule out pulmonary embolism or other abnormality.  Care will be signed out to Dr. Stevie Kern at shift change.  He will obtain the results of the CT scan and determine the final disposition.  CRITICAL CARE Performed by: Geoffery Lyons Total critical care time: 45 minutes Critical care time was exclusive of separately billable procedures and treating other patients. Critical care was necessary to treat or prevent imminent or life-threatening deterioration. Critical care was time spent personally by me on the following activities: development of treatment plan with patient and/or surrogate as well as nursing,  discussions with consultants, evaluation of patient's response to treatment, examination of patient, obtaining history from patient or surrogate, ordering and performing treatments and interventions, ordering and review of laboratory studies, ordering and review of radiographic studies, pulse oximetry and re-evaluation of patient's condition.   Final Clinical Impression(s) / ED Diagnoses Final diagnoses:  None    Rx / DC Orders ED Discharge Orders     None        Geoffery Lyons, MD 05/21/21 2351

## 2021-05-21 NOTE — ED Triage Notes (Signed)
Pt arrived POV for shOB. Per pt she has been feeling short of breath since June 2nd where she was diagnosed with PNA. Denies chest pain.

## 2021-05-21 NOTE — H&P (Addendum)
Date: 05/21/2021               Patient Name:  Natasha Hardy MRN: [id number]  DOB: Mar 06, 1986 Age / Sex: 35 y.o., female   PCP: [facility]              Medical Service: Internal Medicine Teaching Service              Attending Physician: Dr. Inez Catalina, MD    First Contact: Evon Slack, MS4 Pager: [phone]  Second Contact: Dr. Abigail Butts Pager: [phone]  Third Contact Dr. Karilyn Cota Pager: [phone]       After Hours (After 5p/  First Contact Pager: [phone]  weekends / holidays): Second Contact Pager: [phone]   Chief Complaint: Increasing shortness of breath   History of Present Illness:  Natasha Hardy is a 35 y.o. female with no significant past medical history who presented with increasing shortness of breath even with positional change.  Patient was in her usual state of health until  returning from vacation to [facility] [place]  in April when she noted swelling in bilateral hallucis  that became generalized LE edema. She concurrently developed swelling and pain in MCP and MTP joints of both hands. The joint pain was preceded by an itchy rash on the chest that hurts to scratch.  Patient then developed cold-like symptoms which she referred to as ' chest cold' with associated cough that was productive of greenish sputum. She used OTC cold remedies that helped initially but then started having dyspnea with mild exertion that progressed to severe dyspnea walking and now with positional change. Reportedly passed out on Friday 7/15 from dyspnea while going to bathroom. She has had two ED visit and a 5-day hospital admission for persistent pneumonia. CT chest and CXR from these encounters showed persistent bilateral patchy opacities and infiltrate with negative PE work up.  Treated thrice with antibiotics  [Augmentin, doxycycline and azithromycin] with no improvement in her symptoms. Patient had negative rheumatologic work up during recent admission but  reports feeling better with steroids. She was discharged on steroid taper but unable to taper past 20 mg and has also been using duoned inhaler more frequently. Says she gets sicker with lower dose.  She denies fever and chills, chest pain, orthopnea, diarrhea. Has had nausea and interval weight loss due to loss of appetite but otherwise has no other systemic symptoms.  On presentation to the ED today, patient noted to be tachycardic, dyspneic with increasing oxygen requirement initial 2L [place] to satting 83% on 8L high flow that eventually improved to 90%. Repeat CT chest  today shows new  extensive pneumomediastinum extending into the neck bilaterally with no mass or adenopathy as well as persistent bilateral pulmonary infiltrates  of bibasilar airspace disease with air bronchograms, left worse than right. Her dyspnea and oxygen saturation improved with IV solumedrol and nebulized duonebs. Patient looked relatively better than vitals shows.    Meds:  Current Meds  Medication Sig   albuterol (PROVENTIL) (2.5 MG/3ML) 0.083% nebulizer solution Take 3 mLs by nebulization every 6 (six) hours as needed.   albuterol (VENTOLIN HFA) 108 (90 Base) MCG/ACT inhaler Inhale 2 puffs into the lungs every 6 (six) hours as needed.   [DISCONTINUED] azithromycin (ZITHROMAX) 500 MG tablet Take 500 mg by mouth daily.   [DISCONTINUED] cefUROXime (CEFTIN) 500 MG tablet Take 500 mg by mouth 2 (two) times daily.   [DISCONTINUED] doxycycline (VIBRAMYCIN) 100 MG capsule Take 1 capsule (100 mg  total) by mouth 2 (two) times daily.   [DISCONTINUED] gabapentin (NEURONTIN) 300 MG capsule Take 300 mg by mouth daily.     Allergies: Allergies as of 05/21/2021   (No Known Allergies)   No past medical history on file.  Family History: DM, breast and ovarian cancer on maternal side of family and colon cancer in paternal side of family  Social History:She lives with her sons and husband. Denies tobacco or  illicit drug use use  Drinks alcohol 4-5 oz on social occassions.   Review of Systems: A complete ROS was negative except as per HPI.   Vitals Blood pressure 120/82, pulse (!) 123, temperature 98.5 F (36.9 C), temperature source Oral, resp. rate (!) 25, height 5\' 2"  (1.575 m), weight 76.7 kg, SpO2 (!) 88 %.  Physical Exam: General: Sick-appearing reproductive aged female sitting up in bed talking in short sentences. In mild distress. Head: Normocephalic. Atraumatic. CV: Tachycardic, RRR. No murmurs, rubs, or gallops. No LE edema Pulmonary:Diminished breath sounds, patient taking shallow breaths, mildly increase work of breathing. No wheezing or rales appreciated. Abdominal: Soft, nontender, nondistended. Normal bowel sounds. Extremities: Palpable radial and DP pulses. Normal ROM. Skin: Warm and dry. No obvious rash or lesions. Neuro: A&Ox3. Moves all extremities. Normal sensation. No focal deficit. Psych: Normal mood and affect  EKG: Sinus tachycardia  CXR: Low lung volumes with persistent bilateral multilobar pneumonia. Possible trace bilateral pleural effusions.   Assessment & Plan by Problem: Active Problems:   Acute hypoxemic respiratory failure (HCC)  Summary statement Tiera Mensinger is a 35 y.o female with no significant past medical history had had persistent pulmonary infiltrate causing dyspnea who is admitted for acute hypoxic respiratory failure and found to have new pneumomediastinum on imaging.  Hypoxia from persistent PNA, concern for cryptogenic organizing pneumonia (COP) Patient with persistent pulmonary infiltrate and worsening hypoxia requiring two ED visits and one hospital admission within a one month period. No clear etiology of her persistent imaging findings. Differential include COP, hypersensitivity pneumonitis, eosinophilic granulomatosis, sarcoidosis or autoimmune process though has had some rheumatologic work up that were negative. Infectious cause low on differential  given normal WBC and lack of other systemic signs of infection. She was treated with antibiotics three times since onset of symptoms in April 2022. 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 Would continue steriods while futher work-up is undertaken. Pulmonology following, appreciate help. -Start solumedrol 80 mg daily -F/u HIV lab -f/u autoimmune labs per Pulm -CBC with differential and IgE -f/u urine legionella screen F/u on Urine rapid drug screen  -Bronch with BAL and biopsy by pulm when patient stable or VAT biopsy by thoracic surgery.  Pneumomediastinum New extensive pneumomediastinum extending into the neck bilaterally on imaging. Etiology unclear, likely due to paroxysmal coughing. Esophageal rupture unlikely as patient denies recent forceful vomiting  and she is still able to tolerate PO intake without worsening of symptoms. Cardiac and thoracic surgery following. Appreciate recs. F/u esophagram  -Daily CXR to monitor for resolution or  progression   Dispo: Admit patient to Observation with expected length of stay less than 2 midnights.  Signed: May 2022, Medical Student 05/21/2021, 4:07 PM    Attestation for Student Documentation:  I personally was present and performed or re-performed the history, physical exam and medical decision-making activities of this service and have verified that the service and findings are accurately documented in the student's note.  Tedric Leeth N, DO 05/21/2021,  7:29 PM

## 2021-05-21 NOTE — Progress Notes (Signed)
     [address]       [place] [address]             [phone]       Images reviewed, and case discussed. Mediastinal emphysema.  No pleural fluid, or pneumothorax.  Pt does have bibasilar parenchyma consolidation which may also be contributing to her oxygen requirements  Have recommended that an esophagram be performed for completion.  If negative, conservative management.  Stanisha Lorenz Keane Scrape

## 2021-05-21 NOTE — Plan of Care (Signed)

## 2021-05-21 NOTE — Hospital Course (Addendum)
[ ]   Follow-up nonemergent high-resolution chest CT  In 1-2 months to reassess the appearance of the lung parenchyma

## 2021-05-21 NOTE — Progress Notes (Signed)
Paged that patient was tachypneic in the 50s on BiPAP.  Patient evaluated at the bedside and respiratory rate ranged from mid 20s to mid 40s.  Patient states that she felt anxious and tried talking her through some breathing exercises while on BiPAP.  Patient states that her breathing overall feels about the same.  She is maintaining saturations above 90%.  Vitals:   05/21/21 1845 05/21/21 1915  BP: (!) 149/91 119/85  Pulse: (!) 125 93  Resp: (!) 44 (!) 44  Temp:    SpO2: 95% 98%   Assessment and plan: Tachypnea is likely worsening due to patient's anxiety.  Patient continues to maintain oxygen saturations above 90 while on 100% BiPAP.  ABG revealed PO2 of 63, normal bicarb.  Will discuss with Dr. Delton Coombes.

## 2021-05-21 NOTE — ED Notes (Signed)
Report given to [facility]

## 2021-05-21 NOTE — ED Notes (Signed)
Admitting physicians at bedside.

## 2021-05-21 NOTE — ED Notes (Signed)
Patient placed on 2L O2 per triage rn. sats 90% 2L

## 2021-05-21 NOTE — ED Notes (Signed)
Patient placed on HF 02 via humidified [place] @ 8L, assisted patient back to bed, Sp02 noted to improve slightly at 83% at this time. Dr. Stevie Kern at bedside providing updates, family at bedside.

## 2021-05-21 NOTE — ED Notes (Signed)
Patient transported to CT 

## 2021-05-21 NOTE — ED Notes (Signed)
Natasha Lyons, MD informed on pts low O2 sats while at 6L. Stated pts looks comfortable and to continue to monitor w/o NRB

## 2021-05-21 NOTE — Progress Notes (Signed)
Asked to see patient in regards to appears to be steroid responsive pneumonia, previously treated for pneumonia and June with little improvement reportedly until the initiation of steroids.  She apparently a taper down to 20 mg.  On my evaluation the patient is on 100% BiPAP with O2 saturation of 96% respiratory rate 3035.  Initial plan was for her to be admitted to progressive care but I am concerned about her marginal respiratory status.  We will admit the patient to the ICU instead of progressive care unit for closer monitoring of possible declining respiratory status.  Of note the patient did have COVID in January of this year and while the timeframe is not ideal for a diagnosis of post COVID pneumonitis this certainly remains a possibility.  Her CT scan shows consolidation of posterior lower lobe with air bronchograms more consistent with bacterial pneumonia or poorly resolved bacterial pneumonia while there are peripheral changes that are more groundglass in nature more suggestive of Boop or some form of pneumonitis.

## 2021-05-21 NOTE — ED Notes (Signed)
Admitting called to bedside for concerns over PO2 results and pt's resp rate.  Team to bedside to see patient.

## 2021-05-21 NOTE — ED Notes (Signed)
Per Critical care provider, pt will be going to ICU.

## 2021-05-22 ENCOUNTER — Inpatient Hospital Stay (HOSPITAL_COMMUNITY): Payer: 59

## 2021-05-22 ENCOUNTER — Encounter (HOSPITAL_COMMUNITY): Payer: Self-pay | Admitting: Specialist

## 2021-05-22 DIAGNOSIS — R739 Hyperglycemia, unspecified: Secondary | ICD-10-CM | POA: Diagnosis not present

## 2021-05-22 DIAGNOSIS — J9601 Acute respiratory failure with hypoxia: Secondary | ICD-10-CM | POA: Diagnosis not present

## 2021-05-22 DIAGNOSIS — J982 Interstitial emphysema: Secondary | ICD-10-CM

## 2021-05-22 DIAGNOSIS — J849 Interstitial pulmonary disease, unspecified: Secondary | ICD-10-CM

## 2021-05-22 LAB — COMPREHENSIVE METABOLIC PANEL
ALT: 68 U/L — ABNORMAL HIGH (ref 0–44)
AST: 49 U/L — ABNORMAL HIGH (ref 15–41)
Albumin: 3.2 g/dL — ABNORMAL LOW (ref 3.5–5.0)
Alkaline Phosphatase: 59 U/L (ref 38–126)
Anion gap: 10 (ref 5–15)
BUN: 10 mg/dL (ref 6–20)
CO2: 25 mmol/L (ref 22–32)
Calcium: 9.7 mg/dL (ref 8.9–10.3)
Chloride: 104 mmol/L (ref 98–111)
Creatinine, Ser: 0.59 mg/dL (ref 0.44–1.00)
GFR, Estimated: >60 mL/min (ref >60)
Glucose, Bld: 146 mg/dL — ABNORMAL HIGH (ref 70–99)
Potassium: 4.3 mmol/L (ref 3.5–5.1)
Sodium: 139 mmol/L (ref 135–145)
Total Bilirubin: 0.5 mg/dL (ref 0.3–1.2)
Total Protein: 8.2 g/dL — ABNORMAL HIGH (ref 6.5–8.1)

## 2021-05-22 LAB — PROCALCITONIN: Procalcitonin: <0.1 ng/mL

## 2021-05-22 LAB — CBC WITH DIFFERENTIAL/PLATELET
Abs Immature Granulocytes: 0.16 10*3/uL — ABNORMAL HIGH (ref 0.00–0.07)
Basophils Absolute: 0 10*3/uL (ref 0.0–0.1)
Basophils Relative: 0 %
Eosinophils Absolute: 0 10*3/uL (ref 0.0–0.5)
Eosinophils Relative: 0 %
HCT: 43.5 % (ref 36.0–46.0)
Hemoglobin: 14.5 g/dL (ref 12.0–15.0)
Immature Granulocytes: 1 %
Lymphocytes Relative: 7 %
Lymphs Abs: 1 10*3/uL (ref 0.7–4.0)
MCH: 29.4 pg (ref 26.0–34.0)
MCHC: 33.3 g/dL (ref 30.0–36.0)
MCV: 88.2 fL (ref 80.0–100.0)
Monocytes Absolute: 0.9 10*3/uL (ref 0.1–1.0)
Monocytes Relative: 7 %
Neutro Abs: 11.4 10*3/uL — ABNORMAL HIGH (ref 1.7–7.7)
Neutrophils Relative %: 85 %
Platelets: 318 10*3/uL (ref 150–400)
RBC: 4.93 MIL/uL (ref 3.87–5.11)
RDW: 12.9 % (ref 11.5–15.5)
WBC: 13.5 10*3/uL — ABNORMAL HIGH (ref 4.0–10.5)
nRBC: 0 % (ref 0.0–0.2)

## 2021-05-22 LAB — GLUCOSE, CAPILLARY
Glucose-Capillary: 153 mg/dL — ABNORMAL HIGH (ref 70–99)
Glucose-Capillary: 156 mg/dL — ABNORMAL HIGH (ref 70–99)
Glucose-Capillary: 149 mg/dL — ABNORMAL HIGH (ref 70–99)
Glucose-Capillary: 145 mg/dL — ABNORMAL HIGH (ref 70–99)

## 2021-05-22 LAB — HEMOGLOBIN A1C
Hgb A1c MFr Bld: 6.4 % — ABNORMAL HIGH (ref 4.8–5.6)
Mean Plasma Glucose: 136.98 mg/dL

## 2021-05-22 LAB — MRSA NEXT GEN BY PCR, NASAL: MRSA by PCR Next Gen: NOT DETECTED

## 2021-05-22 LAB — GAMMA GT: GGT: 48 U/L (ref 7–50)

## 2021-05-22 LAB — C3 COMPLEMENT: C3 Complement: 148 mg/dL (ref 82–167)

## 2021-05-22 LAB — C4 COMPLEMENT: Complement C4, Body Fluid: 32 mg/dL (ref 12–38)

## 2021-05-22 MED ORDER — IPRATROPIUM-ALBUTEROL 0.5-2.5 (3) MG/3ML IN SOLN
3.0000 mL | RESPIRATORY_TRACT | Status: DC
  Administered 2021-05-22 (×3): 3 mL via RESPIRATORY_TRACT
  Filled 2021-05-22 (×3): qty 3

## 2021-05-22 MED ORDER — LIDOCAINE HCL (PF) 1 % IJ SOLN
INTRAMUSCULAR | Status: AC
  Filled 2021-05-22: qty 5

## 2021-05-22 MED ORDER — HYDROCOD POLST-CPM POLST ER 10-8 MG/5ML PO SUER
5.0000 mL | Freq: Two times a day (BID) | ORAL | Status: DC
  Administered 2021-05-22 – 2021-05-27 (×11): 5 mL via ORAL
  Filled 2021-05-22 (×11): qty 5

## 2021-05-22 MED ORDER — ALBUTEROL SULFATE (2.5 MG/3ML) 0.083% IN NEBU
2.5000 mg | INHALATION_SOLUTION | RESPIRATORY_TRACT | Status: DC | PRN
  Filled 2021-05-22: qty 3

## 2021-05-22 MED ORDER — ALPRAZOLAM 0.5 MG PO TABS
0.5000 mg | ORAL_TABLET | Freq: Three times a day (TID) | ORAL | Status: DC | PRN
  Administered 2021-05-22 – 2021-05-27 (×8): 0.5 mg via ORAL
  Filled 2021-05-22 (×8): qty 1

## 2021-05-22 MED ORDER — INSULIN ASPART 100 UNIT/ML IJ SOLN
1.0000 [IU] | INTRAMUSCULAR | Status: DC
  Administered 2021-05-22 – 2021-05-23 (×3): 1 [IU] via SUBCUTANEOUS
  Administered 2021-05-23: 2 [IU] via SUBCUTANEOUS
  Administered 2021-05-23: 1 [IU] via SUBCUTANEOUS
  Administered 2021-05-23 – 2021-05-24 (×3): 2 [IU] via SUBCUTANEOUS
  Administered 2021-05-24 (×4): 1 [IU] via SUBCUTANEOUS
  Administered 2021-05-24: 3 [IU] via SUBCUTANEOUS
  Administered 2021-05-25 (×2): 2 [IU] via SUBCUTANEOUS
  Administered 2021-05-25: 1 [IU] via SUBCUTANEOUS
  Administered 2021-05-25 – 2021-05-26 (×9): 2 [IU] via SUBCUTANEOUS
  Administered 2021-05-27: 1 [IU] via SUBCUTANEOUS
  Administered 2021-05-27: 2 [IU] via SUBCUTANEOUS
  Administered 2021-05-27: 1 [IU] via SUBCUTANEOUS
  Administered 2021-05-27 – 2021-05-28 (×3): 2 [IU] via SUBCUTANEOUS

## 2021-05-22 NOTE — Procedures (Signed)
Procedures  4:00 PM  PATIENT:  [place]  35 y.o. female  PRE-OPERATIVE DIAGNOSIS:  chest rash  POST-OPERATIVE DIAGNOSIS:  chest ras  PROCEDURE:  punch biopsy x2  SURGEON:  Barnetta Chapel, PA-C  ASSISTANTS: none   ANESTHESIA:   local  DRAINS: none   LOCAL MEDICATIONS USED:  LIDOCAINE   SPECIMEN:  Biopsy / Limited Resection  DISPOSITION OF SPECIMEN:   Pathology and Microbiology  INDICATION FOR PROCEDURE: This is a 35 yo black female who is admitted secondary to dyspnea.  This is her second admission for this.  She is currently on Bipap.  She developed a rash on her chest about 3 weeks ago.  She has used neosporin as well as Desitin.  This was noted this hospitalization and we have been asked to biopsy this for suspicion of fungal rash vs sarcoid vs other etiology.  PROCEDURE: The patient's chest was prepped in the usual sterile way.  1% lidocaine was used to anesthetize the area selected in her right upper chest.  2 separate 9mm punch biopsies were taken from this site.  Each was separately placed in saline.  Pressure was held for hemostasis.  Once this was achieved a dry dressing was placed.   PATIENT DISPOSITION:   remained in her room in stable condition  Letha Cape 4:08 PM 05/22/2021

## 2021-05-22 NOTE — Consult Note (Signed)
Natasha Hardy [birth year]  [id number].    Requesting MD: Dr. Karie Fetch Chief Complaint/Reason for Consult: chest rash  HPI:  This is a 35 yo black female who is admitted secondary to dyspnea.  This is her second admission for this.  She is currently on Bipap.  She developed a rash on her chest about 3 weeks ago.  She has used neosporin as well as Desitin.  This was noted this hospitalization and we have been asked to biopsy this for suspicion of fungal rash vs sarcoid vs other etiology.  ROS: ROS: Please see HPI, otherwise admits to joint pains at times, otherwise all other systems are negative.  History reviewed. No pertinent family history.  History reviewed. No pertinent past medical history.  History reviewed. No pertinent surgical history.  Social History:  reports that she has never smoked. She has never used smokeless tobacco. She reports that she does not drink alcohol and does not use drugs.  Allergies: No Known Allergies  Medications Prior to Admission  Medication Sig Dispense Refill   albuterol (PROVENTIL) (2.5 MG/3ML) 0.083% nebulizer solution Take 3 mLs by nebulization every 6 (six) hours as needed.     albuterol (VENTOLIN HFA) 108 (90 Base) MCG/ACT inhaler Inhale 2 puffs into the lungs every 6 (six) hours as needed.     predniSONE (STERAPRED UNI-PAK 21 TAB) 10 MG (21) TBPK tablet Take 1 tablet by mouth as directed.       Physical Exam: Blood pressure 115/74, pulse 99, temperature 98.5 F (36.9 C), temperature source Oral, resp. rate (!) 38, height 5\' 2"  (1.575 m), weight 75.5 kg, SpO2 94 %. General: pleasant, WD, WN black female who is laying in bed in NAD HEENT: head is normocephalic, atraumatic.  Sclera are noninjected.  PERRL.  Ears and nose without any masses or lesions.  Mouth is pink and dry Heart: tachy.  Normal s1,s2. No obvious murmurs, gallops, or rubs noted.  Palpable radial and pedal pulses bilaterally Lungs: bipap in place, respiratory  effort nonlabored, no wheezing Abd: soft, NT, ND, +BS, no masses, hernias, or organomegaly Skin: warm and dry with no masses, lesions,, but she does have a slightly raised linear excoriated type rash on her chest.   Psych: A&Ox3 with an appropriate affect.   Results for orders placed or performed during the hospital encounter of 05/21/21 (from the past 48 hour(s))  Comprehensive metabolic panel     Status: Abnormal   Collection Time: 05/21/21  5:03 AM  Result Value Ref Range   Sodium 138 135 - 145 mmol/L   Potassium 4.2 3.5 - 5.1 mmol/L   Chloride 104 98 - 111 mmol/L   CO2 25 22 - 32 mmol/L   Glucose, Bld 141 (H) 70 - 99 mg/dL    Comment: Glucose reference range applies only to samples taken after fasting for at least 8 hours.   BUN 6 6 - 20 mg/dL   Creatinine, Ser 05/23/21 0.44 - 1.00 mg/dL   Calcium 9.2 8.9 - 3.55 mg/dL   Total Protein 8.0 6.5 - 8.1 g/dL   Albumin 3.2 (L) 3.5 - 5.0 g/dL   AST 60 (H) 15 - 41 U/L   ALT 78 (H) 0 - 44 U/L   Alkaline Phosphatase 61 38 - 126 U/L   Total Bilirubin 0.5 0.3 - 1.2 mg/dL   GFR, Estimated 97.4 >16 mL/min    Comment: (NOTE) Calculated using the CKD-EPI Creatinine Equation (2021)    Anion gap 9 5 -  15    Comment: Performed at [facility], [address] [place], [place] [address]  CBC     Status: None   Collection Time: 05/21/21  5:03 AM  Result Value Ref Range   WBC 8.7 4.0 - 10.5 K/uL   RBC 4.76 3.87 - 5.11 MIL/uL   Hemoglobin 13.7 12.0 - 15.0 g/dL   HCT 78.243.0 95.636.0 - 21.346.0 %   MCV 90.3 80.0 - 100.0 fL   MCH 28.8 26.0 - 34.0 pg   MCHC 31.9 30.0 - 36.0 g/dL   RDW 08.612.9 57.811.5 - 46.915.5 %   Platelets 315 150 - 400 K/uL   nRBC 0.0 0.0 - 0.2 %    Comment: Performed at [facility], [address] [place], [place] [address]  Troponin I (High Sensitivity)     Status: None   Collection Time: 05/21/21  5:03 AM  Result Value Ref Range   Troponin I (High Sensitivity) 5 <18 ng/L    Comment: (NOTE) Elevated high sensitivity troponin I  (hsTnI) values and significant  changes across serial measurements may suggest ACS but many other  chronic and acute conditions are known to elevate hsTnI results.  Refer to the "Links" section for chest pain algorithms and additional  guidance. Performed at [facility], [address] [place], [place] [address]   Brain natriuretic peptide     Status: None   Collection Time: 05/21/21  5:03 AM  Result Value Ref Range   B Natriuretic Peptide 6.0 0.0 - 100.0 pg/mL    Comment: Performed at [facility], [address] [place], [place] [address]  Resp Panel by RT-PCR (Flu A&B, Covid) Nasopharyngeal Swab     Status: None   Collection Time: 05/21/21  5:48 AM   Specimen: Nasopharyngeal Swab; Nasopharyngeal(NP) swabs in vial transport medium  Result Value Ref Range   SARS Coronavirus 2 by RT PCR NEGATIVE NEGATIVE    Comment: (NOTE) SARS-CoV-2 target nucleic acids are NOT DETECTED.  The SARS-CoV-2 RNA is generally detectable in upper respiratory specimens during the acute phase of infection. The lowest concentration of SARS-CoV-2 viral copies this assay can detect is 138 copies/mL. A negative result does not preclude SARS-Cov-2 infection and should not be used as the sole basis for treatment or other patient management decisions. A negative result may occur with  improper specimen collection/handling, submission of specimen other than nasopharyngeal swab, presence of viral mutation(s) within the areas targeted by this assay, and inadequate number of viral copies(<138 copies/mL). A negative result must be combined with clinical observations, patient history, and epidemiological information. The expected result is Negative.  Fact Sheet for Patients:  BloggerCourse.comhttps://www.fda.gov/media/152166/download  Fact Sheet for Healthcare Providers:  SeriousBroker.ithttps://www.fda.gov/media/152162/download  This test is no t yet approved or cleared by the [place] FDA and  has been authorized for  detection and/or diagnosis of SARS-CoV-2 by FDA under an Emergency Use Authorization (EUA). This EUA will remain  in effect (meaning this test can be used) for the duration of the COVID-19 declaration under Section 564(b)(1) of the Act, 21 U.S.C.section 360bbb-3(b)(1), unless the authorization is terminated  or revoked sooner.       Influenza A by PCR NEGATIVE NEGATIVE   Influenza B by PCR NEGATIVE NEGATIVE    Comment: (NOTE) The Xpert Xpress SARS-CoV-2/FLU/RSV plus assay is intended as an aid in the diagnosis of influenza from Nasopharyngeal swab specimens and should not be used as a sole basis for treatment. Nasal  washings and aspirates are unacceptable for Xpert Xpress SARS-CoV-2/FLU/RSV testing.  Fact Sheet for Patients: BloggerCourse.com  Fact Sheet for Healthcare Providers: SeriousBroker.it  This test is not yet approved or cleared by the [place] FDA and has been authorized for detection and/or diagnosis of SARS-CoV-2 by FDA under an Emergency Use Authorization (EUA). This EUA will remain in effect (meaning this test can be used) for the duration of the COVID-19 declaration under Section 564(b)(1) of the Act, 21 U.S.C. section 360bbb-3(b)(1), unless the authorization is terminated or revoked.  Performed at [facility], [address] [place], [place] [address]   Troponin I (High Sensitivity)     Status: None   Collection Time: 05/21/21  7:23 AM  Result Value Ref Range   Troponin I (High Sensitivity) 5 <18 ng/L    Comment: (NOTE) Elevated high sensitivity troponin I (hsTnI) values and significant  changes across serial measurements may suggest ACS but many other  chronic and acute conditions are known to elevate hsTnI results.  Refer to the "Links" section for chest pain algorithms and additional  guidance. Performed at [facility], [address], [place], [place] [address]   HIV Antibody  (routine testing w rflx)     Status: None   Collection Time: 05/21/21 12:13 PM  Result Value Ref Range   HIV Screen 4th Generation wRfx Non Reactive Non Reactive    Comment: Performed at [facility], [address] [place], [place] [address]  CBC with Differential/Platelet     Status: Abnormal   Collection Time: 05/21/21  1:50 PM  Result Value Ref Range   WBC 7.4 4.0 - 10.5 K/uL   RBC 4.89 3.87 - 5.11 MIL/uL   Hemoglobin 14.1 12.0 - 15.0 g/dL   HCT 65.7 84.6 - 96.2 %   MCV 89.4 80.0 - 100.0 fL   MCH 28.8 26.0 - 34.0 pg   MCHC 32.3 30.0 - 36.0 g/dL   RDW 95.2 84.1 - 32.4 %   Platelets 338 150 - 400 K/uL   nRBC 0.0 0.0 - 0.2 %   Neutrophils Relative % 84 %   Neutro Abs 6.2 1.7 - 7.7 K/uL   Lymphocytes Relative 10 %   Lymphs Abs 0.8 0.7 - 4.0 K/uL   Monocytes Relative 5 %   Monocytes Absolute 0.3 0.1 - 1.0 K/uL   Eosinophils Relative 0 %   Eosinophils Absolute 0.0 0.0 - 0.5 K/uL   Basophils Relative 0 %   Basophils Absolute 0.0 0.0 - 0.1 K/uL   Immature Granulocytes 1 %   Abs Immature Granulocytes 0.08 (H) 0.00 - 0.07 K/uL    Comment: Performed at [facility], [address] [place], [place] [address]  C3 complement     Status: None   Collection Time: 05/21/21  2:01 PM  Result Value Ref Range   C3 Complement 148 82 - 167 mg/dL    Comment: (NOTE) Performed At: [facility] [address] [place], [place] [id number] Jolene Schimke MD [id number]   C4 complement     Status: None   Collection Time: 05/21/21  2:01 PM  Result Value Ref Range   Complement C4, Body Fluid 32 12 - 38 mg/dL    Comment: (NOTE) Performed At: [facility] [address] [place], [place] [id number] Jolene Schimke MD OA:[id number]   I-Stat arterial blood gas, ED     Status: Abnormal   Collection Time: 05/21/21  4:53 PM  Result Value Ref  Range   pH, Arterial 7.416 7.350 - 7.450   pCO2 arterial 40.2 32.0 - 48.0 mmHg   pO2, Arterial 63 (L) 83.0 - 108.0 mmHg    Bicarbonate 25.9 20.0 - 28.0 mmol/L   TCO2 27 22 - 32 mmol/L   O2 Saturation 92.0 %   Acid-Base Excess 1.0 0.0 - 2.0 mmol/L   Sodium 141 135 - 145 mmol/L   Potassium 4.1 3.5 - 5.1 mmol/L   Calcium, Ion 1.31 1.15 - 1.40 mmol/L   HCT 50.0 (H) 36.0 - 46.0 %   Hemoglobin 17.0 (H) 12.0 - 15.0 g/dL   Collection site Radial    Drawn by RT    Sample type ARTERIAL   MRSA Next Gen by PCR, Nasal     Status: None   Collection Time: 05/21/21 10:17 PM   Specimen: Nasal Mucosa; Nasal Swab  Result Value Ref Range   MRSA by PCR Next Gen NOT DETECTED NOT DETECTED    Comment: (NOTE) The GeneXpert MRSA Assay (FDA approved for NASAL specimens only), is one component of a comprehensive MRSA colonization surveillance program. It is not intended to diagnose MRSA infection nor to guide or monitor treatment for MRSA infections. Test performance is not FDA approved in patients less than 46 years old. Performed at [facility], [address], [place], [place] [address]   Glucose, capillary     Status: Abnormal   Collection Time: 05/21/21 10:20 PM  Result Value Ref Range   Glucose-Capillary 137 (H) 70 - 99 mg/dL    Comment: Glucose reference range applies only to samples taken after fasting for at least 8 hours.  CBC     Status: Abnormal   Collection Time: 05/21/21 10:24 PM  Result Value Ref Range   WBC 14.0 (H) 4.0 - 10.5 K/uL   RBC 4.70 3.87 - 5.11 MIL/uL   Hemoglobin 13.8 12.0 - 15.0 g/dL   HCT 24.5 80.9 - 98.3 %   MCV 88.1 80.0 - 100.0 fL   MCH 29.4 26.0 - 34.0 pg   MCHC 33.3 30.0 - 36.0 g/dL   RDW 38.2 50.5 - 39.7 %   Platelets 307 150 - 400 K/uL   nRBC 0.0 0.0 - 0.2 %    Comment: Performed at [facility], [address], [place], [place] [address]  Creatinine, serum     Status: None   Collection Time: 05/21/21 10:24 PM  Result Value Ref Range   Creatinine, Ser 0.46 0.44 - 1.00 mg/dL   GFR, Estimated >93 >79 mL/min    Comment: (NOTE) Calculated using the CKD-EPI Creatinine  Equation (2021) Performed at [facility], [address], [place], [place] [address]   Comprehensive metabolic panel     Status: Abnormal   Collection Time: 05/22/21  1:07 AM  Result Value Ref Range   Sodium 139 135 - 145 mmol/L   Potassium 4.3 3.5 - 5.1 mmol/L   Chloride 104 98 - 111 mmol/L   CO2 25 22 - 32 mmol/L   Glucose, Bld 146 (H) 70 - 99 mg/dL    Comment: Glucose reference range applies only to samples taken after fasting for at least 8 hours.   BUN 10 6 - 20 mg/dL   Creatinine, Ser 7.35 0.44 - 1.00 mg/dL   Calcium 9.7 8.9 - 32.9 mg/dL   Total Protein 8.2 (H) 6.5 - 8.1 g/dL   Albumin 3.2 (L) 3.5 - 5.0 g/dL   AST 49 (H) 15 - 41  U/L   ALT 68 (H) 0 - 44 U/L   Alkaline Phosphatase 59 38 - 126 U/L   Total Bilirubin 0.5 0.3 - 1.2 mg/dL   GFR, Estimated >16 >10 mL/min    Comment: (NOTE) Calculated using the CKD-EPI Creatinine Equation (2021)    Anion gap 10 5 - 15    Comment: Performed at [facility], [address] [place], [place] [address]  CBC with Differential/Platelet     Status: Abnormal   Collection Time: 05/22/21  1:07 AM  Result Value Ref Range   WBC 13.5 (H) 4.0 - 10.5 K/uL   RBC 4.93 3.87 - 5.11 MIL/uL   Hemoglobin 14.5 12.0 - 15.0 g/dL   HCT 40.9 81.1 - 91.4 %   MCV 88.2 80.0 - 100.0 fL   MCH 29.4 26.0 - 34.0 pg   MCHC 33.3 30.0 - 36.0 g/dL   RDW 78.2 95.6 - 21.3 %   Platelets 318 150 - 400 K/uL    Comment: Immature Platelet Fraction may be clinically indicated, consider ordering this additional test [id number] REPEATED TO VERIFY    nRBC 0.0 0.0 - 0.2 %   Neutrophils Relative % 85 %   Neutro Abs 11.4 (H) 1.7 - 7.7 K/uL   Lymphocytes Relative 7 %   Lymphs Abs 1.0 0.7 - 4.0 K/uL   Monocytes Relative 7 %   Monocytes Absolute 0.9 0.1 - 1.0 K/uL   Eosinophils Relative 0 %   Eosinophils Absolute 0.0 0.0 - 0.5 K/uL   Basophils Relative 0 %   Basophils Absolute 0.0 0.0 - 0.1 K/uL   Immature Granulocytes 1 %   Abs Immature Granulocytes 0.16  (H) 0.00 - 0.07 K/uL    Comment: Performed at [facility], [address] [place], [place] [address]  Glucose, capillary     Status: Abnormal   Collection Time: 05/22/21  3:32 AM  Result Value Ref Range   Glucose-Capillary 153 (H) 70 - 99 mg/dL    Comment: Glucose reference range applies only to samples taken after fasting for at least 8 hours.  Glucose, capillary     Status: Abnormal   Collection Time: 05/22/21  8:19 AM  Result Value Ref Range   Glucose-Capillary 156 (H) 70 - 99 mg/dL    Comment: Glucose reference range applies only to samples taken after fasting for at least 8 hours.  Procalcitonin - Baseline     Status: None   Collection Time: 05/22/21  8:51 AM  Result Value Ref Range   Procalcitonin <0.10 ng/mL    Comment: SLIGHT HEMOLYSIS Interpretation: PCT (Procalcitonin) <= 0.5 ng/mL: Systemic infection (sepsis) is not likely. Local bacterial infection is possible. (NOTE)       Sepsis PCT Algorithm           Lower Respiratory Tract                                      Infection PCT Algorithm    ----------------------------     ----------------------------         PCT < 0.25 ng/mL                PCT < 0.10 ng/mL          Strongly encourage             Strongly discourage   discontinuation of antibiotics    initiation of antibiotics    ----------------------------     -----------------------------  PCT 0.25 - 0.50 ng/mL            PCT 0.10 - 0.25 ng/mL               OR       >80% decrease in PCT            Discourage initiation of                                            antibiotics      Encourage discontinuation           of antibiotics    ----------------------------     -----------------------------         PCT >= 0.50 ng/mL              PCT 0.26 - 0.50 ng/mL                AND       <80% decrease in PCT             Encourage initiation of                                             antibiotics       Encourage continuation           of antibiotics     ----------------------------     -----------------------------        PCT >= 0.50 ng/mL                  PCT > 0.50 ng/mL               AND         increase in PCT                  Strongly encourage                                      initiation of antibiotics    Strongly encourage escalation           of antibiotics                                     -----------------------------                                           PCT <= 0.25 ng/mL                                                 OR                                        > 80% decrease in PCT  Discontinue / Do not initiate                                             antibiotics  Performed at [facility], [address] [place], [place] [address]   Gamma GT     Status: None   Collection Time: 05/22/21  1:18 PM  Result Value Ref Range   GGT 48 7 - 50 U/L    Comment: Performed at [facility], [address], [place], [place] [address]  Hemoglobin A1c     Status: Abnormal   Collection Time: 05/22/21  2:03 PM  Result Value Ref Range   Hgb A1c MFr Bld 6.4 (H) 4.8 - 5.6 %    Comment: (NOTE) Pre diabetes:          5.7%-6.4%  Diabetes:              >6.4%  Glycemic control for   <7.0% adults with diabetes    Mean Plasma Glucose 136.98 mg/dL    Comment: Performed at [facility], [address] [place], [place] [address]   CT Angio Chest PE W and/or Wo Contrast  Result Date: 05/21/2021 CLINICAL DATA:  Shortness of breath, recent history of pneumonia, high prob PE EXAM: CT ANGIOGRAPHY CHEST WITH CONTRAST TECHNIQUE: Multidetector CT imaging of the chest was performed using the standard protocol during bolus administration of intravenous contrast. Multiplanar CT image reconstructions and MIPs were obtained to evaluate the vascular anatomy. CONTRAST:  OMNIPAQUE IOHEXOL 350 MG/ML SOLN COMPARISON:  04/16/2021 FINDINGS: Cardiovascular: Heart size normal. No pericardial  effusion. Satisfactory opacification of pulmonary arteries noted, and there is no evidence of pulmonary emboli. Adequate contrast opacification of the thoracic aorta with no evidence of dissection, aneurysm, or stenosis. There is classic 3-vessel brachiocephalic arch anatomy without proximal stenosis. No significant atheromatous change. Mediastinum/Nodes: Extensive pneumomediastinum extending into the neck bilaterally. No mass or adenopathy. Lungs/Pleura: No pneumothorax. Bibasilar airspace disease with air bronchograms, left worse than right, relatively stable since prior scan. There are perihilar and peripheral mild ground-glass opacities in the upper lobes which have slightly increased on the left and slightly improved on the right since the previous examination. Upper Abdomen: No acute findings. Musculoskeletal: No acute findings Review of the MIP images confirms the above findings. IMPRESSION: 1. New extensive pneumomediastinum extending into the neck. Given the extant pulmonary pathology, favor barotrauma as etiology over esophageal perforation. 2. Little change in bilateral pulmonary infiltrates as detailed above. 3. Negative for acute PE or thoracic aortic dissection. Electronically Signed   By: Corlis Leak M.D.   On: 05/21/2021 08:44   DG Chest Port 1 View  Result Date: 05/22/2021 CLINICAL DATA:  Shortness of breath EXAM: PORTABLE CHEST 1 VIEW COMPARISON:  05/21/2021 FINDINGS: Bilateral lower lobe airspace disease. No pleural effusion or pneumothorax. Stable cardiomediastinal silhouette. Stable small volume pneumomediastinum. No acute osseous abnormality. IMPRESSION: 1. Bilateral lower lobe pneumonia. 2. Small volume pneumomediastinum unchanged from the prior exam. Electronically Signed   By: Elige Ko   On: 05/22/2021 08:28   DG Chest Portable 1 View  Result Date: 05/21/2021 CLINICAL DATA:  35 year old female with history of shortness of breath. EXAM: PORTABLE CHEST 1 VIEW COMPARISON:  Chest  x-ray 04/27/2021. FINDINGS: Lung volumes are low. There continues to be some ill-defined opacities throughout the mid to lower lungs bilaterally (  left greater than right), compatible with residual areas of airspace consolidation. Possible trace bilateral pleural effusions. No pneumothorax. No definite suspicious appearing pulmonary nodules or masses. Heart size is normal. Upper mediastinal contours are within normal limits. IMPRESSION: 1. Low lung volumes with persistent bilateral multilobar pneumonia. Possible trace bilateral pleural effusions. Given the longevity of these findings on prior chest radiographs, the possibility of developing post infectious fibrosis warrants consideration. Outpatient referral to Pulmonology for further clinical evaluation is suggested. Follow-up nonemergent high-resolution chest CT should be considered in the next 1-2 months to reassess the appearance of the lung parenchyma. Electronically Signed   By: Trudie Reed M.D.   On: 05/21/2021 07:09      Assessment/Plan Dyspnea Chest rash Punch biopsies performed and taken personally to microbiology for fungal culture as well as to pathology for specimen.  Clean dry gauze placed over wound.  This can be changed tomorrow.  No further surgical needs.  We will sign off.   Letha Cape, [facility] Surgery 05/22/2021, 3:47 PM Please see Amion for pager number during day hours 7:00am-4:30pm or 7:00am -11:30am on weekends

## 2021-05-22 NOTE — Progress Notes (Addendum)
NAME:  Natasha Hardy, MRN:  [id number], DOB:  10/25/1986, LOS: 1 ADMISSION DATE:  05/21/2021, CONSULTATION DATE: 05/21/2021 REFERRING MD: Dr. Stevie Kern, I MTS, CHIEF COMPLAINT: Abnormal CT chest, pneumomediastinum  History of Present Illness:  35 year old woman, never smoker, with past medical history.  She began to have edema, joint pain as early as April 2022.  Evaluated for progressive dyspnea June 2 and found to have abnormal CT chest with bilateral patchy groundglass pulmonary infiltrates with a relative peripheral distribution including the apices and both bases which were more consolidated.  Her COVID-19 was negative.  She was treated with antibiotics without significant improvement and was reevaluated 04/16/2021.  Another CT chest showed progression of bilateral patchy groundglass infiltrates again in a peripheral pattern with a slight basilar gradient, associated with prominent mediastinal nodes.  She was again treated unsuccessfully with antibiotics.  She was admitted in [facility] 6/20 through 6/24, repeat CT there again showed persistent patchy peripheral consolidation and groundglass with progression.  Her COVID 19 testing was negative on each occasion.  She received empiric IV antibiotics as well as IV corticosteroids, transitioned to prednisone and tapered.  Presented with progressive exertional SOB, paroxysmal cough that is mostly non-productive. No N/V or dry heaves.     Pertinent  Medical History  Gestational diabetes Iron deficiency anemia LE arthralgias and swelling, associated with rash, distal digital petechiae - negative Rheum eval per her report.  COVID positive Jan 2022  Significant Hospital Events: Including procedures, antibiotic start and stop dates in addition to other pertinent events   Multiple CT scans of the chest since 04/06/2021 as an outpatient and then when admitted in [facility] with bilateral pulmonary infiltrates Echocardiogram (HP) 04/26/21 >> LVEF  65-70%, normal LV size, normal RV size and function 7/17: admitted. Steroids and abx started. Noting had improved somewhat on steroids in past but symptoms got worse again when tapered down to 20mg .  PCCM consulted. We felt likely non-infectious etiology. COP, eosinophilic granulomatosis or Churg-Strauss, sarcoidosis. Consider autoimmune mediated interstitial infiltrates. Autoimmune panel, HSP panel sent. Oxygen needs worse. Required BIPAP requiring Benzos for anxiety.  7/`8 HIV NR. In ICU.    Interim History / Subjective:  Feels 10% better  Objective   Blood pressure 121/78, pulse 95, temperature 98.5 F (36.9 C), temperature source Oral, resp. rate (Abnormal) 24, height 5\' 2"  (1.575 m), weight 75.5 kg, SpO2 98 %.    FiO2 (%):  [80 %-100 %] 80 %   Intake/Output Summary (Last 24 hours) at 05/22/2021 0739 Last data filed at 05/22/2021 0300 Gross per 24 hour  Intake 410.63 ml  Output no documentation  Net 410.63 ml   Filed Weights   05/21/21 0445 05/22/21 0000 05/22/21 0230  Weight: 76.7 kg 75.5 kg 75.5 kg    Examination: General this is a otherwise healthy appearing 35 yof she is on bipap ut in no distress HENT Sclera not icteric. No JVD BIPAP mask is in place Pulm faint basilar crackles no accessory use but RR in mid 30s. This seemed to improve while I examined her Card tachy rrr Ext warm and dry pulses are strong CR brisk no edema Abd soft Neuro intact   Resolved Hospital Problem list     Assessment & Plan:   Acute hypoxemic respiratory failure due to bilateral pulmonary infiltrates.  Working ddx:  cryptogenic organizing pneumonia (possibly following viral syndrome for her original presentation in early June), eosinophilic granulomatosis or Churg-Strauss, sarcoidosis. Consider autoimmune mediated interstitial infiltrates given her joint discomfort, swelling,  rash but prior w/up neg -HIV NR HSP panel, ACE level and autoimmune panel sent, IgE pending. CBC w/ dfiff w/out  eosinophils  -has been on BIPAP  Plan Cont supplemental oxygen-->trying heated high flow w/ plan to alternate BIPAP PRN Keep NPO Send PCT F/u sent HSP, autoimmune panel and Ace leve If intubated would obtain cell ct w/ dff, cytology and cultre.  Cont current steroid dosing  Day 2 azith and zozyn Am cxr  Keep in ICU  Pneumomediastinum.  Likely due to paroxysmal hard coughing.  No evidence of pneumothorax on chest CT Plan Cough suppression Daily cxr Dc esophagram (no vomiting or retching. Can't do ir right now anyway)  Mild hyperglycemia Plan  Cont to monitor  Glucose goal 140-180  Best Practice (right click and "Reselect all SmartList Selections" daily)   Diet/type: NPO w/ oral meds DVT prophylaxis: LMWH GI prophylaxis: H2B Lines: N/A Foley:  N/A Code Status:  full code Last date of multidisciplinary goals of care discussion [place] ]  My cct 34 minutes  Simonne Martinet ACNP-BC [facility] Pulmonary/Critical Care Pager # [phone] OR # [phone] if no answer

## 2021-05-23 ENCOUNTER — Inpatient Hospital Stay (HOSPITAL_COMMUNITY): Payer: 59

## 2021-05-23 DIAGNOSIS — D849 Immunodeficiency, unspecified: Secondary | ICD-10-CM | POA: Diagnosis not present

## 2021-05-23 DIAGNOSIS — J9601 Acute respiratory failure with hypoxia: Secondary | ICD-10-CM | POA: Diagnosis not present

## 2021-05-23 DIAGNOSIS — R739 Hyperglycemia, unspecified: Secondary | ICD-10-CM | POA: Diagnosis not present

## 2021-05-23 DIAGNOSIS — J849 Interstitial pulmonary disease, unspecified: Secondary | ICD-10-CM | POA: Diagnosis not present

## 2021-05-23 LAB — GLUCOSE, CAPILLARY
Glucose-Capillary: 129 mg/dL — ABNORMAL HIGH (ref 70–99)
Glucose-Capillary: 146 mg/dL — ABNORMAL HIGH (ref 70–99)
Glucose-Capillary: 148 mg/dL — ABNORMAL HIGH (ref 70–99)
Glucose-Capillary: 152 mg/dL — ABNORMAL HIGH (ref 70–99)
Glucose-Capillary: 162 mg/dL — ABNORMAL HIGH (ref 70–99)
Glucose-Capillary: 172 mg/dL — ABNORMAL HIGH (ref 70–99)

## 2021-05-23 LAB — ALDOLASE: Aldolase: 10.3 U/L (ref 3.3–10.3)

## 2021-05-23 LAB — SJOGRENS SYNDROME-A EXTRACTABLE NUCLEAR ANTIBODY: SSA (Ro) (ENA) Antibody, IgG: 0.7 AI (ref 0.0–0.9)

## 2021-05-23 LAB — PROCALCITONIN: Procalcitonin: <0.1 ng/mL

## 2021-05-23 LAB — SJOGRENS SYNDROME-B EXTRACTABLE NUCLEAR ANTIBODY: SSB (La) (ENA) Antibody, IgG: <0.2 AI (ref 0.0–0.9)

## 2021-05-23 LAB — ANCA TITERS
Atypical P-ANCA titer: <1:20 {titer}
C-ANCA: <1:20 {titer}
P-ANCA: <1:20 {titer}

## 2021-05-23 LAB — ANTI-SMITH ANTIBODY: ENA SM Ab Ser-aCnc: <0.2 AI (ref 0.0–0.9)

## 2021-05-23 LAB — ANGIOTENSIN CONVERTING ENZYME: Angiotensin-Converting Enzyme: 20 U/L (ref 14–82)

## 2021-05-23 LAB — RHEUMATOID FACTOR: Rheumatoid fact SerPl-aCnc: 19.3 IU/mL — ABNORMAL HIGH (ref <14.0)

## 2021-05-23 LAB — ANTI-SCLERODERMA ANTIBODY: Scleroderma (Scl-70) (ENA) Antibody, IgG: <0.2 AI (ref 0.0–0.9)

## 2021-05-23 MED ORDER — FAMOTIDINE 20 MG PO TABS
40.0000 mg | ORAL_TABLET | Freq: Every day | ORAL | Status: DC
  Administered 2021-05-23 – 2021-05-27 (×4): 40 mg via ORAL
  Filled 2021-05-23 (×4): qty 2

## 2021-05-23 MED ORDER — SULFAMETHOXAZOLE-TRIMETHOPRIM 800-160 MG PO TABS
1.0000 | ORAL_TABLET | ORAL | Status: DC
  Administered 2021-05-24 – 2021-05-26 (×2): 1 via ORAL
  Filled 2021-05-23 (×2): qty 1

## 2021-05-23 MED ORDER — IPRATROPIUM-ALBUTEROL 0.5-2.5 (3) MG/3ML IN SOLN
3.0000 mL | Freq: Four times a day (QID) | RESPIRATORY_TRACT | Status: DC
  Administered 2021-05-23 – 2021-05-24 (×4): 3 mL via RESPIRATORY_TRACT
  Filled 2021-05-23 (×4): qty 3

## 2021-05-23 NOTE — Progress Notes (Addendum)
NAME:  Natasha Hardy, MRN:  [id number], DOB:  31-Mar-1986, LOS: 2 ADMISSION DATE:  05/21/2021, CONSULTATION DATE: 05/21/2021 REFERRING MD: Dr. Stevie Kern, I MTS, CHIEF COMPLAINT: Abnormal CT chest, pneumomediastinum  History of Present Illness:  35 year old woman, never smoker, with past medical history.  She began to have edema, joint pain as early as April 2022.  Evaluated for progressive dyspnea June 2 and found to have abnormal CT chest with bilateral patchy groundglass pulmonary infiltrates with a relative peripheral distribution including the apices and both bases which were more consolidated.  Her COVID-19 was negative.  She was treated with antibiotics without significant improvement and was reevaluated 04/16/2021.  Another CT chest showed progression of bilateral patchy groundglass infiltrates again in a peripheral pattern with a slight basilar gradient, associated with prominent mediastinal nodes.  She was again treated unsuccessfully with antibiotics.  She was admitted in [facility] 6/20 through 6/24, repeat CT there again showed persistent patchy peripheral consolidation and groundglass with progression.  Her COVID 19 testing was negative on each occasion.  She received empiric IV antibiotics as well as IV corticosteroids, transitioned to prednisone and tapered.  Presented with progressive exertional SOB, paroxysmal cough that is mostly non-productive. No N/V or dry heaves.     Pertinent  Medical History  Gestational diabetes Iron deficiency anemia LE arthralgias and swelling, associated with rash, distal digital petechiae - negative Rheum eval per her report.  COVID positive Jan 2022  Significant Hospital Events: Including procedures, antibiotic start and stop dates in addition to other pertinent events   Multiple CT scans of the chest since 04/06/2021 as an outpatient and then when admitted in [facility] with bilateral pulmonary infiltrates Echocardiogram (HP) 04/26/21 >> LVEF  65-70%, normal LV size, normal RV size and function 7/17: admitted. Steroids and abx started. Noting had improved somewhat on steroids in past but symptoms got worse again when tapered down to 20mg .  PCCM consulted. We felt likely non-infectious etiology. COP, eosinophilic granulomatosis or Churg-Strauss, sarcoidosis. Consider autoimmune mediated interstitial infiltrates. Autoimmune panel, HSP panel sent. Oxygen needs worse. Required BIPAP requiring Benzos for anxiety.  7/18 HIV NR. In ICU. Skin bx obtained  7/19 Much better. Off CPAP/BIPAP all night. RF +. Diet added. OOB orders placed    Interim History / Subjective:  No distress. Feels much better  Objective   Blood pressure 115/76, pulse (Abnormal) 116, temperature 97.9 F (36.6 C), temperature source Axillary, resp. rate (Abnormal) 39, height 5\' 2"  (1.575 m), weight 76.2 kg, SpO2 (Abnormal) 89 %.    FiO2 (%):  [80 %-100 %] 90 %   Intake/Output Summary (Last 24 hours) at 05/23/2021 0901 Last data filed at 05/23/2021 0800 Gross per 24 hour  Intake 620.25 ml  Output 800 ml  Net -179.75 ml   Filed Weights   05/22/21 0000 05/22/21 0230 05/23/21 0452  Weight: 75.5 kg 75.5 kg 76.2 kg    Examination: General 35 year old female she is resting in bed she is in no acute distress this morning and looks remarkably improved when comparing exam from yesterday HEENT normocephalic atraumatic no jugular venous distention appreciated Pulmonary: Resting comfortably no accessory use currently 80% heated high flow but has room to decrease FiO2 saturations in mid 90s Cardiac: General remains tachycardic but no murmur rub or gallop Abdomen: Soft not tender no organomegaly extremities warm dry brisk capillary refill no edema Neuro awake oriented no focal deficits appreciated GU voids spontaneously.  Resolved Hospital Problem list     Assessment & Plan:  Acute hypoxemic respiratory failure due to bilateral pulmonary infiltrates.  Working ddx:   cryptogenic organizing pneumonia (possibly following viral syndrome for her original presentation in early June), eosinophilic granulomatosis or Churg-Strauss, sarcoidosis. Consider autoimmune mediated interstitial infiltrates given her joint discomfort, swelling, rash but prior w/up neg -HIV NR, rheumatoid factor positive at 19.3. HSP panel, ACE level and autoimmune panel sent, IgE pending. CBC w/ dfiff w/out eosinophils  -Off noninvasive ventilation all night.  -Chest x-ray personally reviewed no significant change bilateral airspace disease Plan Continue supplemental oxygen with pulse oximetry.  Seems to be tolerating high flow oxygen at this point I think we can change BiPAP to as needed  Get her out of bed  Advance diet as  Follow-up skin biopsy pathology  Follow-up HSP, autoimmune panel and ACE level  Day #3 azithromycin and Zosyn, will discontinue Zosyn  Continue current steroid dosing  A.m. chest x-ray Keeping in the intensive care setting  Pneumomediastinum.  Likely due to paroxysmal hard coughing.  No evidence of pneumothorax on chest CT Plan Cough suppression  Mild hyperglycemia Plan  Sliding scale insulin   Best Practice (right click and "Reselect all SmartList Selections" daily)   Diet/type: NPO w/ oral meds DVT prophylaxis: LMWH GI prophylaxis: H2B Lines: N/A Foley:  N/A Code Status:  full code Last date of multidisciplinary goals of care discussion [parents updated at bedside 7/18 ]  My cct 34 minutes  Simonne Martinet ACNP-BC [facility] Pulmonary/Critical Care Pager # [phone] OR # [phone] if no answer    Attending attestation:  [name] is feeling improved. Less SOB, but hasn't been OOB yet. No heartburn or nausea. Slept well last night.  BP 115/76 (BP Location: Right Arm)   Pulse (!) 116   Temp 98.3 F (36.8 C) (Oral)   Resp (!) 39   Ht 5\' 2"  (1.575 m)   Wt 76.2 kg   SpO2 (!) 89%   BMI 30.73 kg/m  Healthy appearing young woman lying in bed  in NAD [place]/AT, eyes anicteric Breathing comfortably on HHFNC, CTA. No tachypea or accessory muscle use S1S2, RRR Abd ND, soft No LE edema, no synovitis Skin warm, dry. Rash on chest similar. Awake, alert, answering questions appropriately. Moving all extremities. More awake today.  CXR personally reviewed>  less infiltrates bilaterally, still more lower than upper lobes  PCT <0.1 GGT 48  Aldolase 10.3 WNL RF 19.3 (increased) ENA sm msc Ab WNL ACE WNL SSA & SSB WNL SCL-70 WNL  RUQ : normal  Assessment & plan: Acute respiratory failure with hypoxia-- better with steroids, had flared when coming off steroids after previous hospitalization at [facility] in late June-- flared when down to 20mg  prednisone daily. H/o rash and foot & hand pain, symmetric, better with steroids this spring raises question of autoimmune associated disease. Not likely OI with <1 month of steroids. IPAF, sarcoid,  RA-ILD all possible. Post-URI BOOP also possible. Not likely related to covid (she had it over 6 months ago and had no symptoms for about 5 months) -Waiting on skin biopsy results to rule out granulomas -waiting on CCP to confirm RF elevation -follows as an outpatient with Dr. ; previous serologies not revealing -con't weaning oxygen, increase mobility today -con't high dose steroids. Starting bactrim prophylaxis today given almost 1 month on steroids already.  -stop CAP antibiotics; PCT remains undetectable -if serologies unrevealing and infiltrates persist on imaging, may eventually require OLB  Hyperglycemia from steroids -SSI PRN -goal BG 140-180 -wean insulin with weaning steroids  Anxiety from respiratory failure -xanax TID; should be able to discontinue soon. Should not be a long-term med   Hopefully will be stable to step down to the floor tomorrow.  Steffanie Dunn, DO 05/23/21 12:53 PM [place] [facility]

## 2021-05-23 NOTE — Plan of Care (Addendum)
Patient receiving treatment for respiratory illness and hypoxia. Remains on HFNC. Activity as tolerated. IS as tolerated.  Update: patient sat on side of bed with assistance for 5 minutes total with this RN today. SPO2 dropped to 80% and HR jumped to 140 before the patient was returned to a lying position.   Problem: Education: Goal: Knowledge of General Education information will improve Description: Including pain rating scale, medication(s)/side effects and non-pharmacologic comfort measures Outcome: Progressing   Problem: Health Behavior/Discharge Planning: Goal: Ability to manage health-related needs will improve Outcome: Progressing   Problem: Clinical Measurements: Goal: Ability to maintain clinical measurements within normal limits will improve Outcome: Progressing Goal: Will remain free from infection Outcome: Progressing Goal: Diagnostic test results will improve Outcome: Progressing Goal: Respiratory complications will improve Outcome: Progressing Goal: Cardiovascular complication will be avoided Outcome: Progressing   Problem: Activity: Goal: Risk for activity intolerance will decrease Outcome: Progressing   Problem: Nutrition: Goal: Adequate nutrition will be maintained Outcome: Progressing   Problem: Coping: Goal: Level of anxiety will decrease Outcome: Progressing   Problem: Elimination: Goal: Will not experience complications related to bowel motility Outcome: Progressing Goal: Will not experience complications related to urinary retention Outcome: Progressing   Problem: Pain Managment: Goal: General experience of comfort will improve Outcome: Progressing   Problem: Safety: Goal: Ability to remain free from injury will improve Outcome: Progressing   Problem: Skin Integrity: Goal: Risk for impaired skin integrity will decrease Outcome: Progressing

## 2021-05-24 ENCOUNTER — Inpatient Hospital Stay (HOSPITAL_COMMUNITY): Payer: 59

## 2021-05-24 DIAGNOSIS — J9601 Acute respiratory failure with hypoxia: Secondary | ICD-10-CM | POA: Diagnosis not present

## 2021-05-24 DIAGNOSIS — J982 Interstitial emphysema: Secondary | ICD-10-CM | POA: Diagnosis not present

## 2021-05-24 DIAGNOSIS — D84821 Immunodeficiency due to drugs: Secondary | ICD-10-CM

## 2021-05-24 DIAGNOSIS — Z7952 Long term (current) use of systemic steroids: Secondary | ICD-10-CM

## 2021-05-24 DIAGNOSIS — R739 Hyperglycemia, unspecified: Secondary | ICD-10-CM | POA: Diagnosis not present

## 2021-05-24 LAB — GLUCOSE, CAPILLARY
Glucose-Capillary: 125 mg/dL — ABNORMAL HIGH (ref 70–99)
Glucose-Capillary: 135 mg/dL — ABNORMAL HIGH (ref 70–99)
Glucose-Capillary: 139 mg/dL — ABNORMAL HIGH (ref 70–99)
Glucose-Capillary: 140 mg/dL — ABNORMAL HIGH (ref 70–99)
Glucose-Capillary: 152 mg/dL — ABNORMAL HIGH (ref 70–99)
Glucose-Capillary: 213 mg/dL — ABNORMAL HIGH (ref 70–99)

## 2021-05-24 LAB — STREP PNEUMONIAE URINARY ANTIGEN: Strep Pneumo Urinary Antigen: NEGATIVE

## 2021-05-24 LAB — IGE: IgE (Immunoglobulin E), Serum: 53 IU/mL (ref 6–495)

## 2021-05-24 LAB — RAPID URINE DRUG SCREEN, HOSP PERFORMED
Amphetamines: NOT DETECTED
Barbiturates: NOT DETECTED
Benzodiazepines: POSITIVE — AB
Cocaine: NOT DETECTED
Opiates: POSITIVE — AB
Tetrahydrocannabinol: NOT DETECTED

## 2021-05-24 LAB — CYCLIC CITRUL PEPTIDE ANTIBODY, IGG/IGA: CCP Antibodies IgG/IgA: 10 units (ref 0–19)

## 2021-05-24 LAB — PROCALCITONIN: Procalcitonin: <0.1 ng/mL

## 2021-05-24 MED ORDER — FUROSEMIDE 10 MG/ML IJ SOLN
40.0000 mg | Freq: Once | INTRAMUSCULAR | Status: AC
  Administered 2021-05-24: 40 mg via INTRAVENOUS
  Filled 2021-05-24: qty 4

## 2021-05-24 MED ORDER — IPRATROPIUM-ALBUTEROL 0.5-2.5 (3) MG/3ML IN SOLN
3.0000 mL | RESPIRATORY_TRACT | Status: DC
  Administered 2021-05-24 – 2021-05-27 (×17): 3 mL via RESPIRATORY_TRACT
  Filled 2021-05-24 (×19): qty 3

## 2021-05-24 MED ORDER — FUROSEMIDE 10 MG/ML IJ SOLN
20.0000 mg | Freq: Once | INTRAMUSCULAR | Status: AC
  Administered 2021-05-24: 20 mg via INTRAVENOUS
  Filled 2021-05-24: qty 2

## 2021-05-24 MED ORDER — SODIUM CHLORIDE 0.9 % IV SOLN
250.0000 mg | Freq: Four times a day (QID) | INTRAVENOUS | Status: AC
  Administered 2021-05-24 – 2021-05-27 (×12): 250 mg via INTRAVENOUS
  Filled 2021-05-24 (×12): qty 2

## 2021-05-24 NOTE — Progress Notes (Signed)
Spoke with pt prior to attempting to get up into the chair about anxiety management. She favored wearing a nonrebreather mask with high flow instead of a dose of xanax beforehand.  Increased pts HFHH to 30L,100% and placed nonrebreather at 100%. Sats were in the mid to high 90's prior to starting movement. Got pt to edge of bed, standing, and pivoted to bedside commode with extra time to calm her nerves. Sats remained mid to high 80's but pt in no visible respiratory distress. After a few minutes pt became tachycardic 140's, short shallow breathing, very anxious and sats dropped to the 70's. RT called to beside while this RN assisted pt back to bed.  Gave PRN xanax but sats remained in the low 80's.   RT placed pt on bipap to increase oxygen saturations faster since HFNC and NRB both at 100% were not bringing her back up.   Sat goals >88%

## 2021-05-24 NOTE — Progress Notes (Signed)
Rt called to bedside by RN due to pt desat into the 70s while on NRB and HFNC 30L 100%. Rt placed pt on CPAP. RN at bedside, RT will continue to monitor.

## 2021-05-24 NOTE — Progress Notes (Addendum)
NAME:  Natasha Hardy, MRN:  [id number], DOB:  Jul 06, 1986, LOS: 3 ADMISSION DATE:  05/21/2021, CONSULTATION DATE: 05/21/2021 REFERRING MD: Dr. Stevie Kern, I MTS, CHIEF COMPLAINT: Abnormal CT chest, pneumomediastinum  History of Present Illness:  35 year old woman, never smoker, with past medical history.  She began to have edema, joint pain as early as April 2022.  Evaluated for progressive dyspnea June 2 and found to have abnormal CT chest with bilateral patchy groundglass pulmonary infiltrates with a relative peripheral distribution including the apices and both bases which were more consolidated.  Her COVID-19 was negative.  She was treated with antibiotics without significant improvement and was reevaluated 04/16/2021.  Another CT chest showed progression of bilateral patchy groundglass infiltrates again in a peripheral pattern with a slight basilar gradient, associated with prominent mediastinal nodes.  She was again treated unsuccessfully with antibiotics.  She was admitted in [facility] 6/20 through 6/24, repeat CT there again showed persistent patchy peripheral consolidation and groundglass with progression.  Her COVID 19 testing was negative on each occasion.  She received empiric IV antibiotics as well as IV corticosteroids, transitioned to prednisone and tapered.  Presented with progressive exertional SOB, paroxysmal cough that is mostly non-productive. No N/V or dry heaves.    Pertinent  Medical History  Gestational diabetes Iron deficiency anemia LE arthralgias and swelling, associated with rash, distal digital petechiae - negative Rheum eval per her report.  COVID positive Jan 2022  Significant Hospital Events: Including procedures, antibiotic start and stop dates in addition to other pertinent events   Multiple CT scans of the chest since 04/06/2021 as an outpatient and then when admitted in [facility] with bilateral pulmonary infiltrates Echocardiogram (HP) 04/26/21 >> LVEF 65-70%,  normal LV size, normal RV size and function 7/17: admitted. Steroids and abx started. Noting had improved somewhat on steroids in past but symptoms got worse again when tapered down to 20mg .  PCCM consulted. We felt likely non-infectious etiology. COP, eosinophilic granulomatosis or Churg-Strauss, sarcoidosis. Consider autoimmune mediated interstitial infiltrates. Autoimmune panel, HSP panel sent. Oxygen needs worse. Required BIPAP requiring Benzos for anxiety.  7/18 HIV NR. In ICU. Skin bx obtained  7/19 Much better. Off CPAP/BIPAP all night. RF +. Diet added. OOB orders placed; Dc'd zosyn and azithromycin; started Bactrim 7/20: on heated HFNC 80%   Interim History / Subjective:  No distress. Has been off BiPAP since 7/18. On Heated HFNC 80% 20 l/m. Pulling 750 on IS.   Objective   Blood pressure 105/71, pulse (!) 110, temperature (!) 97.5 F (36.4 C), temperature source Oral, resp. rate 20, height 5\' 2"  (1.575 m), weight 72.7 kg, SpO2 96 %.    FiO2 (%):  [80 %-90 %] 85 %   Intake/Output Summary (Last 24 hours) at 05/24/2021 0701 Last data filed at 05/24/2021 0358 Gross per 24 hour  Intake 50.14 ml  Output 600 ml  Net -549.86 ml    Filed Weights   05/22/21 0230 05/23/21 0452 05/24/21 0355  Weight: 75.5 kg 76.2 kg 72.7 kg    Examination: General:  NAD HEENT: MM pink/moist; On HHFNC Neuro: Aox4 CV: s1s2, RRR, no m/r/g PULM:  dim BS bilaterally; On heated HFNC 80% 20 l/m with sats in mid 90s GI: soft, bsx4 active Extremities: warm/dry, no edema  Skin: no rashes or lesions  CXR: improved pneumomediastinum; persistent bibasilar infiltrates. Small bilateral pleural effusions.   Resolved Hospital Problem list     Assessment & Plan:   Acute hypoxemic respiratory failure due to bilateral  pulmonary infiltrates.  Working ddx:  cryptogenic organizing pneumonia (possibly following viral syndrome for her original presentation in early June), eosinophilic granulomatosis or  Churg-Strauss, sarcoidosis. Consider autoimmune mediated interstitial infiltrates given her joint discomfort, swelling, rash but prior w/up neg -HIV NR, rheumatoid factor positive at 19.3. Ace normal; auto-immune panel negative. HSP panel, IgE pending. CBC w/ dfiff w/out eosinophils  P: -continue to wean Heated HFNC for sats >90% -BiPAP prn -OOB; PT/OT -IS and pulm toiletry -follow up skin biopsy -follow up HSP panel -follow up urine legionelle, strep -continue Bactrum -continue steroids  Pneumomediastinum.  Likely due to paroxysmal hard coughing.  No evidence of pneumothorax on chest CT P: -continue cough suppression   Mild hyperglycemia P: -SSI and CBG monitoring    Anxiety from respiratory failure P: -xanax prn; consider discontinuing    Best Practice (right click and "Reselect all SmartList Selections" daily)   Diet/type: full liquids  DVT prophylaxis: LMWH GI prophylaxis: H2B Lines: N/A Foley:  N/A Code Status:  full code Last date of multidisciplinary goals of care discussion [mother and patient updated at bedside 7/20 ]  My cct 35 minutes  JD Anselm Lis [place] [facility] 05/24/2021, 7:29 AM  Please see [email or web address] for pager details.  From 7A-7P if no response, please call [phone]. After hours, please call ELink [phone].

## 2021-05-25 ENCOUNTER — Encounter (HOSPITAL_COMMUNITY): Payer: Self-pay | Admitting: Specialist

## 2021-05-25 DIAGNOSIS — J9601 Acute respiratory failure with hypoxia: Secondary | ICD-10-CM | POA: Diagnosis not present

## 2021-05-25 DIAGNOSIS — R7401 Elevation of levels of liver transaminase levels: Secondary | ICD-10-CM

## 2021-05-25 DIAGNOSIS — J982 Interstitial emphysema: Secondary | ICD-10-CM | POA: Diagnosis not present

## 2021-05-25 LAB — POCT I-STAT 7, (LYTES, BLD GAS, ICA,H+H)
Acid-Base Excess: 7 mmol/L — ABNORMAL HIGH (ref 0.0–2.0)
Bicarbonate: 32.7 mmol/L — ABNORMAL HIGH (ref 20.0–28.0)
Calcium, Ion: 1.23 mmol/L (ref 1.15–1.40)
HCT: 42 % (ref 36.0–46.0)
Hemoglobin: 14.3 g/dL (ref 12.0–15.0)
O2 Saturation: 86 %
Patient temperature: 97.6
Potassium: 3.6 mmol/L (ref 3.5–5.1)
Sodium: 139 mmol/L (ref 135–145)
TCO2: 34 mmol/L — ABNORMAL HIGH (ref 22–32)
pCO2 arterial: 46.2 mmHg (ref 32.0–48.0)
pH, Arterial: 7.456 — ABNORMAL HIGH (ref 7.350–7.450)
pO2, Arterial: 48 mmHg — ABNORMAL LOW (ref 83.0–108.0)

## 2021-05-25 LAB — COMPREHENSIVE METABOLIC PANEL
ALT: 54 U/L — ABNORMAL HIGH (ref 0–44)
AST: 44 U/L — ABNORMAL HIGH (ref 15–41)
Albumin: 2.6 g/dL — ABNORMAL LOW (ref 3.5–5.0)
Alkaline Phosphatase: 38 U/L (ref 38–126)
Anion gap: 7 (ref 5–15)
BUN: 18 mg/dL (ref 6–20)
CO2: 29 mmol/L (ref 22–32)
Calcium: 9 mg/dL (ref 8.9–10.3)
Chloride: 100 mmol/L (ref 98–111)
Creatinine, Ser: 0.59 mg/dL (ref 0.44–1.00)
GFR, Estimated: >60 mL/min (ref >60)
Glucose, Bld: 162 mg/dL — ABNORMAL HIGH (ref 70–99)
Potassium: 4 mmol/L (ref 3.5–5.1)
Sodium: 136 mmol/L (ref 135–145)
Total Bilirubin: 0.4 mg/dL (ref 0.3–1.2)
Total Protein: 6.6 g/dL (ref 6.5–8.1)

## 2021-05-25 LAB — CBC
HCT: 39.7 % (ref 36.0–46.0)
Hemoglobin: 12.6 g/dL (ref 12.0–15.0)
MCH: 28.1 pg (ref 26.0–34.0)
MCHC: 31.7 g/dL (ref 30.0–36.0)
MCV: 88.6 fL (ref 80.0–100.0)
Platelets: 306 10*3/uL (ref 150–400)
RBC: 4.48 MIL/uL (ref 3.87–5.11)
RDW: 12.6 % (ref 11.5–15.5)
WBC: 5.9 10*3/uL (ref 4.0–10.5)
nRBC: 0 % (ref 0.0–0.2)

## 2021-05-25 LAB — GLUCOSE, CAPILLARY
Glucose-Capillary: 137 mg/dL — ABNORMAL HIGH (ref 70–99)
Glucose-Capillary: 158 mg/dL — ABNORMAL HIGH (ref 70–99)
Glucose-Capillary: 159 mg/dL — ABNORMAL HIGH (ref 70–99)
Glucose-Capillary: 174 mg/dL — ABNORMAL HIGH (ref 70–99)
Glucose-Capillary: 181 mg/dL — ABNORMAL HIGH (ref 70–99)
Glucose-Capillary: 185 mg/dL — ABNORMAL HIGH (ref 70–99)
Glucose-Capillary: 196 mg/dL — ABNORMAL HIGH (ref 70–99)

## 2021-05-25 LAB — LEGIONELLA PNEUMOPHILA SEROGP 1 UR AG: L. pneumophila Serogp 1 Ur Ag: NEGATIVE

## 2021-05-25 LAB — SURGICAL PATHOLOGY

## 2021-05-25 MED ORDER — LEVALBUTEROL HCL 0.63 MG/3ML IN NEBU: 0.6300 mg | INHALATION_SOLUTION | RESPIRATORY_TRACT | Status: DC | PRN

## 2021-05-25 MED ORDER — ADULT MULTIVITAMIN W/MINERALS CH
1.0000 | ORAL_TABLET | Freq: Every day | ORAL | Status: DC
  Administered 2021-05-25 – 2021-05-27 (×3): 1 via ORAL
  Filled 2021-05-25 (×3): qty 1

## 2021-05-25 MED ORDER — ENSURE ENLIVE PO LIQD
237.0000 mL | Freq: Three times a day (TID) | ORAL | Status: DC
  Administered 2021-05-25 – 2021-05-27 (×6): 237 mL via ORAL

## 2021-05-25 MED ORDER — POLYETHYLENE GLYCOL 3350 17 G PO PACK
17.0000 g | PACK | Freq: Every day | ORAL | Status: DC
  Filled 2021-05-25 (×2): qty 1

## 2021-05-25 NOTE — Progress Notes (Signed)
I have reviewed Natasha Hardy's case with lung transplant medicine at [facility], who agree with transfer for further evaluation. She has been accepted to the MICU under Dr. Dustin Flock. She and her parents have been updated and agree with transfer.  Steffanie Dunn, DO 05/25/21 1:15 PM [place] [facility]

## 2021-05-25 NOTE — Progress Notes (Signed)
PT Cancellation Note  Patient Details Name: Natasha Hardy MRN: [id number] DOB: 17-Feb-1986   Cancelled Treatment:    Reason Eval/Treat Not Completed: Patient not medically ready (pt currently supine with HHFNC and NRB with sats 87% with pt unable to recover per RN from limited mobility and not currently appropriate for eval)   [name] B [name] 05/25/2021, 10:42 AM Merryl Hacker, PT Acute Rehabilitation Services Pager: [phone] Office: [phone]

## 2021-05-25 NOTE — Progress Notes (Signed)
eLink Physician-Brief Progress Note Patient Name: Natasha Hardy DOB: [birth year] MRN: [id number]   Date of Service  05/25/2021  HPI/Events of Note  Sinus tachycardia - HR increased into 150's post Albuterol neb. Unclear if increase WOB played a role as well.   eICU Interventions  Plan: D/C Albuterol Neb Q 3 hours PRN. Xopenex 0.63 mg via neb Q 4 hours PRN SOB or wheezing.     Intervention Category Major Interventions: Arrhythmia - evaluation and management  Regginald Pask [place] 05/25/2021, 9:10 PM

## 2021-05-25 NOTE — Discharge Summary (Addendum)
Physician Discharge Summary       Patient ID: Natasha Hardy MRN: [id number] DOB/AGE: 35-Apr-1987 35 y.o.  Admit date: 05/21/2021 Discharge date: 05/27/2021  Discharge Diagnoses:  Active Problems:   Acute hypoxemic respiratory failure (HCC)   Respiratory failure (HCC)   Protein-calorie malnutrition, severe   History of Present Illness:  35 year old woman, never smoker, with past medical history of iron deficiency anemia, Covid positive 2022.  She began to have edema, joint pain, LE arthralgia as early as April 2022.  Evaluated for progressive dyspnea June 2 and found to have abnormal CT chest with bilateral patchy groundglass pulmonary infiltrates with a relative peripheral distribution including the apices and both bases which were more consolidated.  Her COVID-19 was negative.  She was treated with antibiotics without significant improvement and was re-evaluated 04/16/2021.  Another CT chest showed progression of bilateral patchy groundglass infiltrates again in a peripheral pattern with a slight basilar gradient, associated with prominent mediastinal nodes.  She was again treated unsuccessfully with antibiotics.  She was admitted in [facility] 6/20 through 6/24, repeat CT there again showed persistent patchy peripheral consolidation and groundglass with progression. Echocardiogram (HP) 04/26/21 >> LVEF 65-70%, normal LV size, normal RV size and function. Her COVID 19 testing was negative on each occasion.  She received empiric IV antibiotics as well as IV corticosteroids, transitioned to prednisone and tapered. Patient admitted to [facility] on 7/17 with progressive exertional SOB, paroxysmal cough that is mostly non-productive. Patient was 87% on room air requiring oxygen. No N/V or dry heaves.  Hospital Course:  On 7/17, patient was admitted to [facility]. Patient was started on Steroids and empiric antibiotics (zosyn and azithromycin). Covid/flu negative. Afebrile  with WBC normal around 6. ABG normal with mild hypoxemia. Patient required heated HFNC to maintain sats above 90%. Patient later required BiPAP administration due to hypoxia. Prn benzos for anxiety. CT chest shows pneumomediastinum extending into neck. Bibasilar  airspace disease with air bronchograms, left worse than right. Perihilar and peripheral mild ground-glass opacities in the upper lobes. Patient was admitted to ICU. Felt not likely infectious etiology. Possibly cryptogenic organizing pneumonia, eosinophilic granulomatosis or Churg-Strauss, sarcoidosis. Considering autoimmune mediated interstitial infiltrates. Autoimmune panel, HSP panel sent. Skin biopsy sent. Rheumatoid factor was positive. ANCAs negative. SSA & SSB negative. CCP negative. Sm msc ab negative. SCL-70 negative. Aldolase WNL. Skin bx> no fungal forms on micro, surigcal pathology is benign with no evidence of amyloid deposition. HIV negative. ACE normal. CBC dif without eosinophils. Urine strep negative. Urine legionella pending.  Over the next few days, patient has been on and off heated HFNC to BiPAP as needed. Stopped zosyn and azithromycin and started on Bactrim on 7/19. Pneumomediastinum improving on CXR 7/20 and given tussionex for cough suppression. Patient given one dose of lasix without significant improvement in respiratory status. Duonebs scheduled q4. On 7/20 patient has very low reserve and will have oxygen desaturations down to 70s when getting to side of the bed to use bed pan requiring non-rebreather and 100% heated HFNC. Will sometimes require BiPAP 100% to help recruit her oxygen sats to lower 90s. Having to limit mobility due to desaturation events. Patient has consistently required 80-100% fio2 to maintain sats above 88% for the last few days. Patient Solumedrol increased from 60 to 250 mg q6.  Dr. Carlis Abbott spoke with [facility] team to discuss options for possible transfer for further work-up and  possible lung transplant. Will plan to transfer to [facility] for higher level of care.  On 7/22, father of patient states that 35 year old their is family history of platypnea orthodeoxia syndrome. Patient oxygenation does improve better with lying down flat. Echo with bubble study performed laying down and sitting up. Echo with sitting up suggesting patent foramen ovale; bubbles noted at 7 cardiac cycles. Cardiology is consulted.  On 7/23 she had progressive respiratory deterioration. She required intubation and was started on a vecuronium drip. [facility] was again notified by Dr. Tamala Julian for evaluation and transfer.   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 Abnormal remodeling vs diffuse AVM. No family history of orthodeoxia per Dr. Tamala Julian. 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  -LTVV strategy with tidal volumes of 6 cc/kg ideal body weight -Continue flolan -Follow intermittent CXR and ABG PRN -VAP bundle -Continue vecuronium drip with propofol and fentanyl for sedation -Solumedrol 372m BID -Continue bactrim   -Continue full dose AC given the risk of paradoxical embolism.   Anisocoria: Notified by bedside RN PM shift 7/23. Head CT obtained. Normal Head CT. No hemorrhage, mass, or extra-axial collection. Normal brain parenchyma per  radiologist. 320mdifference L 6 > R 3. Suspect secondary to paralytic -Monitor -Continue paralytic drip as is needed to ensure oxygenation by improving compliance with ventilator.   Pneumomediastinum.  Likely due to paroxysmal hard coughing.  No evidence of pneumothorax on chest  CT. Plan: -Monitor -Now paralyzed and sedated. -continue Tussionex  Mild transaminitis: Origin unclear -Monitor  -Goal MAP greater than 65   Mild hyperglycemia likely steroid induced.  Plan: -Continue SSI and CBG monitoring.  Anxiety from respiratory failure:  Plan: -sedation as discussed   Constipation: no bowel movement since 7/17.  Plan: -Continue bowel regimen  Mild desquamating chest rash- path read as postinflammatory hyperpigmentation -Monitor   Significant Hospital tests/ studies  Consults: Surgery: performed skin biopsy for chest rash sent for fungal culture and pathology.  Discharge Exam: BP (!) 81/62   Pulse (!) 122   Temp (!) 101.3 F (38.5 C)   Resp (!) 24   Ht 5' 2"  (1.575 m)   Wt 75.2 kg   SpO2 (!) 88%   BMI 30.32 kg/m   2.72m28mlevo 1mg21m hr vecuronium 200mc59m fentanyl 50mcg46mpropofol  General:  in bed, NAD, ill appearing HEENT: MM pink/moist, ianicteric, atraumatic Neuro: sedated, on chemical paralysis, RASS -5, L 6 R 3, non reactive CV: S1S2, ST, no m/r/g appreciated PULM:  coarse in the upper lobes,coarse in the lower lobes, Trachea midline, chest expansion symmetric GI: soft, bsx4 active, non disended Extremities: warm/dry, no pretibial edema, capillary refill less than 3 seconds  Skin: mild chest rash, no other rashes or lesions noted    Labs at discharge Lab Results  Component Value Date   CREATININE 0.52 05/27/2021   BUN 17 05/27/2021   NA 138 05/27/2021   K 4.6 05/27/2021   CL 101 05/27/2021   CO2 29 05/27/2021   Lab Results  Component Value Date   WBC 7.4 05/27/2021   HGB 16.0 (H) 05/27/2021   HCT 47.0 (H) 05/27/2021   MCV 89.0 05/27/2021   PLT 241 05/27/2021   Lab Results  Component Value Date   ALT 74 (H)  05/27/2021   AST 50 (H) 05/27/2021   GGT 48 05/22/2021   ALKPHOS 61 05/27/2021   BILITOT 0.5 05/27/2021   Lab Results  Component Value Date   INR 1.0 05/26/2021    Current radiology studies DG  Chest 1 View  Result Date: 05/27/2021 CLINICAL DATA:  Pneumomediastinum EXAM: CHEST  1 VIEW COMPARISON:  May 21, 2021.  May 27, 2021 FINDINGS: The cardiomediastinal silhouette is unchanged in contour.Decreased conspicuity of pneumomediastinum seen on prior CT. No pleural effusion. No pneumothorax. Mildly increased bilateral heterogeneous airspace opacities since most recent prior. Gaseous distension of bowel beneath the LEFT hemidiaphragm. No acute osseous abnormality. IMPRESSION: 1. Bilateral airspace opacities, mildly increased in comparison to most recent prior. 2. Previously described pneumomediastinum is not visualized on today's exam. Electronically Signed   By: Valentino Saxon MD   On: 05/27/2021 16:17   CT HEAD WO CONTRAST  Result Date: 05/27/2021 CLINICAL DATA:  Anisocoria EXAM: CT HEAD WITHOUT CONTRAST TECHNIQUE: Contiguous axial images were obtained from the base of the skull through the vertex without intravenous contrast. COMPARISON:  08/18/2015 FINDINGS: Brain: There is no mass, hemorrhage or extra-axial collection. The size and configuration of the ventricles and extra-axial CSF spaces are normal. The brain parenchyma is normal, without acute or chronic infarction. Vascular: No abnormal hyperdensity of the major intracranial arteries or dural venous sinuses. No intracranial atherosclerosis. Skull: The visualized skull base, calvarium and extracranial soft tissues are normal. Sinuses/Orbits: No fluid levels or advanced mucosal thickening of the visualized paranasal sinuses. No mastoid or middle ear effusion. The orbits are normal. IMPRESSION: Normal head CT. Electronically Signed   By: Ulyses Jarred M.D.   On: 05/27/2021 21:10   DG CHEST PORT 1 VIEW  Result Date: 05/27/2021 CLINICAL DATA:  Endotracheal tube placement EXAM: PORTABLE CHEST 1 VIEW COMPARISON:  05/27/2021 at 4:01 p.m. FINDINGS: Tip of the endotracheal tube is near the carina but indeterminate due to superimposition with the  subclavian line which overlaps and discitis is the termination of the endotracheal tube tip. Motion artifact also blurs the tip of the endotracheal tube. Repeat radiography recommended. Left subclavian line tip: Right atrium. There seem to be some linear lucencies around the aortic arch, compatible with pneumomediastinum. Bilateral airspace opacities, mildly increased.  Low lung volumes. IMPRESSION: 1. The endotracheal tube tip is probably about at the carina but is obscured by motion artifact and superimposition with the subclavian catheter, thus the tube tip is indeterminate. Repeat chest radiography is recommended. 2. Linear lucency around the aortic arch region, compatible with pneumomediastinum. 3. Left subclavian line tip: Right atrium.  No visible pneumothorax. 4. Nonspecific bilateral airspace opacities. Electronically Signed   By: Van Clines M.D.   On: 05/27/2021 18:11   DG Chest Port 1 View  Result Date: 05/27/2021 CLINICAL DATA:  Respiratory failure. EXAM: PORTABLE CHEST 1 VIEW COMPARISON:  05/24/2021 FINDINGS: 0535 hours. Interval progression of diffuse airspace disease in the left lung with progression of peripheral airspace opacity on the right. The cardio pericardial silhouette is enlarged. The visualized bony structures of the thorax show no acute abnormality. Telemetry leads overlie the chest. IMPRESSION: Interval progression of bilateral airspace disease. Electronically Signed   By: Misty Stanley M.D.   On: 05/27/2021 08:29   ECHOCARDIOGRAM LIMITED BUBBLE STUDY  Result Date: 05/26/2021    ECHOCARDIOGRAM LIMITED REPORT   Patient Name:   ODA PLACKE Date of Exam: 05/26/2021 Medical Rec #:  [id number]  Height:       62.0 in Accession #:    [id number]           Weight:       163.1 lb Date of Birth:  January 27, 1986            BSA:          1.753 m Patient Age:    35 years             BP:           117/76 mmHg Patient Gender: F                    HR:           111 bpm. Exam  Location:  Inpatient Procedure: 2D Echo, Cardiac Doppler and Color Doppler STAT ECHO                             MODIFIED REPORT: This report was modified by Kirk Ruths MD on 05/26/2021 due to change.  Indications:     Syncope  History:         Patient has prior history of Echocardiogram examinations, most                  recent 05/26/2021.  Sonographer:     Clayton Lefort RDCS (AE) Referring Phys:  [id number] Candee Furbish Diagnosing Phys: Kirk Ruths MD IMPRESSIONS  1. Saline microcavitation study only; markedly positive study suggesting patent foramen ovale; bubbles noted at 6 cardiac cycles.  2. Agitated saline contrast bubble study was positive with shunting observed within 3-6 cardiac cycles suggestive of interatrial shunt. FINDINGS  IAS/Shunts: Agitated saline contrast was given intravenously to evaluate for intracardiac shunting. Agitated saline contrast bubble study was positive with shunting observed within 3-6 cardiac cycles suggestive of interatrial shunt. Additional Comments: Saline microcavitation study only; markedly positive study suggesting patent foramen ovale; bubbles noted at 6 cardiac cycles. Kirk Ruths MD Electronically signed by Kirk Ruths MD Signature Date/Time: 05/26/2021/3:50:50 PM    Final (Updated)    ECHOCARDIOGRAM COMPLETE BUBBLE STUDY  Result Date: 05/26/2021    ECHOCARDIOGRAM REPORT   Patient Name:   GENETTE HUERTAS Date of Exam: 05/26/2021 Medical Rec #:  [id number]            Height:       62.0 in Accession #:    [id number]           Weight:       163.1 lb Date of Birth:  [birth year]            BSA:          1.753 m Patient Age:    66 years             BP:           127/85 mmHg Patient Gender: F                    HR:           97 bpm. Exam Location:  Inpatient Procedure: 2D Echo, Cardiac Doppler, Color Doppler, Saline Contrast Bubble Study            and Intracardiac Opacification Agent Indications:    Q21.1 Patent foramen Ovale  History:        Patient has no prior  history of Echocardiogram examinations.  Signs/Symptoms:Dyspnea and Shortness of Breath. Hypoxia.  Sonographer:    Roseanna Rainbow RDCS Referring Phys: [id number] Candee Furbish  Sonographer Comments: Technically difficult study due to poor echo windows. IMPRESSIONS  1. Intracavitary gradient of 2.5 m/s likely related to hyperdynamic LV function. Saline microcavitation study technically difficult but appears to be negative for shunt.  2. Left ventricular ejection fraction, by estimation, is >75%. The left ventricle has hyperdynamic function. The left ventricle has no regional wall motion abnormalities. Left ventricular diastolic parameters were normal.  3. Right ventricular systolic function is normal. The right ventricular size is normal.  4. The mitral valve is normal in structure. No evidence of mitral valve regurgitation. No evidence of mitral stenosis.  5. The aortic valve is tricuspid. Aortic valve regurgitation is trivial. No aortic stenosis is present.  6. The inferior vena cava is normal in size with greater than 50% respiratory variability, suggesting right atrial pressure of 3 mmHg.  7. Agitated saline contrast bubble study was negative, with no evidence of any interatrial shunt. FINDINGS  Left Ventricle: Left ventricular ejection fraction, by estimation, is >75%. The left ventricle has hyperdynamic function. The left ventricle has no regional wall motion abnormalities. Definity contrast agent was given IV to delineate the left ventricular endocardial borders. The left ventricular internal cavity size was normal in size. There is no left ventricular hypertrophy. Left ventricular diastolic parameters were normal. Right Ventricle: The right ventricular size is normal.Right ventricular systolic function is normal. Left Atrium: Left atrial size was normal in size. Right Atrium: Right atrial size was normal in size. Pericardium: Trivial pericardial effusion is present. Mitral Valve: The mitral valve is  normal in structure. No evidence of mitral valve regurgitation. No evidence of mitral valve stenosis. Tricuspid Valve: The tricuspid valve is normal in structure. Tricuspid valve regurgitation is trivial. No evidence of tricuspid stenosis. Aortic Valve: The aortic valve is tricuspid. Aortic valve regurgitation is trivial. No aortic stenosis is present. Pulmonic Valve: The pulmonic valve was normal in structure. Pulmonic valve regurgitation is trivial. No evidence of pulmonic stenosis. Aorta: The aortic root is normal in size and structure. Venous: The inferior vena cava is normal in size with greater than 50% respiratory variability, suggesting right atrial pressure of 3 mmHg. IAS/Shunts: No atrial level shunt detected by color flow Doppler. Agitated saline contrast was given intravenously to evaluate for intracardiac shunting. Agitated saline contrast bubble study was negative, with no evidence of any interatrial shunt. Additional Comments: Intracavitary gradient of 2.5 m/s likely related to hyperdynamic LV function. Saline microcavitation study technically difficult but appears to be negative for shunt.  LEFT VENTRICLE PLAX 2D LVIDd:         3.50 cm     Diastology LVIDs:         1.80 cm     LV e' medial:    9.25 cm/s LV PW:         1.00 cm     LV E/e' medial:  6.3 LV IVS:        1.50 cm     LV e' lateral:   9.79 cm/s LVOT diam:     1.70 cm     LV E/e' lateral: 6.0 LV SV:         62 LV SV Index:   35 LVOT Area:     2.27 cm  LV Volumes (MOD) LV vol d, MOD A2C: 49.8 ml LV vol d, MOD A4C: 55.9 ml LV vol s, MOD A2C: 17.6 ml LV  vol s, MOD A4C: 16.9 ml LV SV MOD A2C:     32.2 ml LV SV MOD A4C:     55.9 ml LV SV MOD BP:      36.5 ml RIGHT VENTRICLE            IVC RV S prime:     9.79 cm/s  IVC diam: 1.30 cm TAPSE (M-mode): 2.2 cm LEFT ATRIUM             Index       RIGHT ATRIUM          Index LA diam:        3.20 cm 1.83 cm/m  RA Area:     7.06 cm LA Vol (A2C):   12.5 ml 7.13 ml/m  RA Volume:   13.80 ml 7.87 ml/m LA  Vol (A4C):   20.1 ml 11.47 ml/m LA Biplane Vol: 15.8 ml 9.01 ml/m  AORTIC VALVE LVOT Vmax:   175.00 cm/s LVOT Vmean:  118.000 cm/s LVOT VTI:    0.273 m  AORTA Ao Root diam: 2.50 cm Ao Asc diam:  2.80 cm MITRAL VALVE MV Area (PHT): 3.65 cm    SHUNTS MV Decel Time: 208 msec    Systemic VTI:  0.27 m MV E velocity: 58.50 cm/s  Systemic Diam: 1.70 cm MV A velocity: 58.80 cm/s MV E/A ratio:  0.99 Kirk Ruths MD Electronically signed by Kirk Ruths MD Signature Date/Time: 05/26/2021/12:45:59 PM    Final     Disposition:  [facility]   Allergies as of 05/27/2021   No Known Allergies      Medication List     STOP taking these medications    albuterol (2.5 MG/3ML) 0.083% nebulizer solution Commonly known as: PROVENTIL   albuterol 108 (90 Base) MCG/ACT inhaler Commonly known as: VENTOLIN HFA   predniSONE 10 MG (21) Tbpk tablet Commonly known as: STERAPRED UNI-PAK 21 TAB       TAKE these medications    acetaminophen 325 MG tablet Commonly known as: TYLENOL Place 2 tablets (650 mg total) into feeding tube every 6 (six) hours as needed for mild pain (or Fever >/= 101).   artificial tears Oint ophthalmic ointment Commonly known as: LACRILUBE Place 1 application into both eyes every 8 (eight) hours.   chlorhexidine 0.12 % solution Commonly known as: PERIDEX 15 mLs by Mouth Rinse route 2 (two) times daily.   chlorhexidine gluconate (MEDLINE KIT) 0.12 % solution Commonly known as: PERIDEX 15 mLs by Mouth Rinse route 2 (two) times daily. Start taking on: 06/02/2021   Chlorhexidine Gluconate Cloth 2 % Pads Apply 6 each topically daily at 6 (six) AM.   chlorpheniramine-HYDROcodone 10-8 MG/5ML Suer Commonly known as: [place] 5 mLs into feeding tube every 12 (twelve) hours.   docusate 50 MG/5ML liquid Commonly known as: COLACE Place 10 mLs (100 mg total) into feeding tube 2 (two) times daily.   famotidine 40 MG tablet Commonly known as:  PEPCID Take 1 tablet (40 mg total) by mouth daily. Start taking on: 02-Jun-2021   feeding supplement Liqd Take 237 mLs by mouth 3 (three) times daily between meals. Start taking on: 2021/06/02   fentaNYL 10 mcg/ml Soln infusion Inject 50-200 mcg/hr into the vein continuous.   fentaNYL Soln Commonly known as: SUBLIMAZE Inject 50-100 mcg into the vein every 15 (fifteen) minutes as needed (to maintain RASS & CPOT goal.).   heparin 25000 UT/250ML infusion Inject 900 Units/hr  into the vein continuous.   insulin aspart 100 UNIT/ML injection Commonly known as: novoLOG Inject 1-3 Units into the skin every 4 (four) hours. Start taking on: 2021/06/09   methylPREDNISolone sodium succinate 40 mg/mL injection Commonly known as: SOLU-MEDROL Inject 1 mL (40 mg total) into the vein every 12 (twelve) hours. Start taking on: 06-09-21   mouth rinse Liqd solution 15 mLs by Mouth Rinse route 2 (two) times daily.   mouth rinse Liqd solution 15 mLs by Mouth Rinse route 2 times daily at 12 noon and 4 pm. Start taking on: 06-09-2021   multivitamin Liqd Place 15 mLs into feeding tube daily. Start taking on: Jun 09, 2021   norepinephrine 4-5 MG/250ML-% Soln Commonly known as: LEVOPHED Inject 0-40 mcg/min into the vein continuous.   ondansetron 4 MG/2ML Soln injection Commonly known as: ZOFRAN Inject 2 mLs (4 mg total) into the vein every 6 (six) hours as needed for nausea.   pantoprazole 40 MG injection Commonly known as: PROTONIX Inject 40 mg into the vein daily. Start taking on: 06/09/21   polyethylene glycol 17 g packet Commonly known as: MIRALAX / GLYCOLAX Place 17 g into feeding tube daily. Start taking on: June 09, 2021   propofol 1000 MG/100ML Emul injection Commonly known as: DIPRIVAN Inject 0-3,760 mcg/min into the vein continuous.   sodium chloride 0.9 % infusion Inject 10 mLs into the artery as needed (For arterial line maintenance).    sulfamethoxazole-trimethoprim 800-160 MG tablet Commonly known as: BACTRIM DS Place 1 tablet into feeding tube 3 (three) times a week. Start taking on: May 29, 2021   vecuronium 100 mg in Empty Containers Flexible 1 each Inject 0-127.84 mcg/min into the vein continuous.       Continue inhaled Veletri at 50 ng/kg/min  Discharged Condition: critically ill  36 minutes of time have been dedicated to discharge assessment, planning and discharge instructions.   Signed:  Redmond School., MSN, APRN, AGACNP-BC [facility]  05/27/2021 , 9:56 PM  Please see [email or web address] for pager details  If no response, please call [phone] After hours, please call Elink at [phone]

## 2021-05-25 NOTE — Progress Notes (Addendum)
After most recent administration of Duoneb, pt reporting chest pain, is tachycardic to 160. ELink RN on the monitor, will notify MD. O2 sat dropped to 84, non-rebreather placed. Alprazolam given for anxiety.  1223 HR back to 100-110's, chest pressure resolved, O2 sat 90's on 25L/100% HFNC; pt resting comfortably.

## 2021-05-25 NOTE — Progress Notes (Signed)
This chaplain responded to the RN-Jessica's referral for Pt. spiritual care.    The chaplain's invitation for prayer was shared and accepted by the Pt. and Pt. father-James.  The chaplain introduced F/U spiritual care as needed to the family through a RN page.

## 2021-05-25 NOTE — Progress Notes (Signed)
NAME:  Natasha Hardy, MRN:  [id number], DOB:  04-Dec-1985, LOS: 4 ADMISSION DATE:  05/21/2021, CONSULTATION DATE: 05/21/2021 REFERRING MD: Dr. Stevie Kern, I MTS, CHIEF COMPLAINT: Abnormal CT chest, pneumomediastinum  History of Present Illness:  35 year old woman, never smoker, with past medical history.  She began to have edema, joint pain as early as April 2022.  Evaluated for progressive dyspnea June 2 and found to have abnormal CT chest with bilateral patchy groundglass pulmonary infiltrates with a relative peripheral distribution including the apices and both bases which were more consolidated.  Her COVID-19 was negative.  She was treated with antibiotics without significant improvement and was reevaluated 04/16/2021.  Another CT chest showed progression of bilateral patchy groundglass infiltrates again in a peripheral pattern with a slight basilar gradient, associated with prominent mediastinal nodes.  She was again treated unsuccessfully with antibiotics.  She was admitted in [facility] 6/20 through 6/24, repeat CT there again showed persistent patchy peripheral consolidation and groundglass with progression.  Her COVID 19 testing was negative on each occasion.  She received empiric IV antibiotics as well as IV corticosteroids, transitioned to prednisone and tapered.  Presented with progressive exertional SOB, paroxysmal cough that is mostly non-productive. No N/V or dry heaves.    Pertinent  Medical History  Gestational diabetes Iron deficiency anemia LE arthralgias and swelling, associated with rash, distal digital petechiae - negative Rheum eval per her report.  COVID positive Jan 2022  Significant Hospital Events: Including procedures, antibiotic start and stop dates in addition to other pertinent events   Multiple CT scans of the chest since 04/06/2021 as an outpatient and then when admitted in [facility] with bilateral pulmonary infiltrates Echocardiogram (HP) 04/26/21 >> LVEF 65-70%,  normal LV size, normal RV size and function 7/17: admitted. Steroids and abx started. Noting had improved somewhat on steroids in past but symptoms got worse again when tapered down to 20mg .  PCCM consulted. We felt likely non-infectious etiology. COP, eosinophilic granulomatosis or Churg-Strauss, sarcoidosis. Consider autoimmune mediated interstitial infiltrates. Autoimmune panel, HSP panel sent. Oxygen needs worse. Required BIPAP requiring Benzos for anxiety.  7/18 HIV NR. In ICU. Skin bx obtained  7/19 Much better. Off CPAP/BIPAP all night. RF +. Diet added. OOB orders placed; Dc'd zosyn and azithromycin; started Bactrim 7/20: on heated HFNC 80-100%; required a few hours of BiPAP after mobilizing and desating to 70s.   Interim History / Subjective:  Patient yesterday was not able to mobilize without her desating to the 70s on NRB and 100% heated HFNC. Required a few hours of BiPAP to recruit her back up.  Overnight slept well without BiPAP. Heated HFNC still on 100% 30 l/m. Sats 96%. UOP -1,700; given lasix yesterday. Patient states no bowel movement since admission.  Objective   Blood pressure 117/77, pulse 78, temperature (!) 97.5 F (36.4 C), temperature source Oral, resp. rate (!) 24, height 5\' 2"  (1.575 m), weight 71.7 kg, SpO2 96 %.    FiO2 (%):  [85 %-100 %] 100 %   Intake/Output Summary (Last 24 hours) at 05/25/2021 0704 Last data filed at 05/24/2021 1746 Gross per 24 hour  Intake --  Output 1600 ml  Net -1600 ml    Filed Weights   05/23/21 0452 05/24/21 0355 05/25/21 0500  Weight: 76.2 kg 72.7 kg 71.7 kg    Examination: General:  NAD HEENT: MM pink/moist; On HHFNC Neuro: Aox4 CV: s1s2, RRR, no m/r/g PULM:  dim BS bilaterally; On heated HFNC 100% 30 l/m with sats in  mid 90s GI: soft, bsx4 active Extremities: warm/dry, no edema  Skin: no rashes or lesions  Urine strep negative Urine legionella pending  Resolved Hospital Problem list     Assessment & Plan:    Acute hypoxemic respiratory failure due to bilateral pulmonary infiltrates.  Working ddx:  cryptogenic organizing pneumonia (possibly following viral syndrome for her original presentation in early June), eosinophilic granulomatosis or Churg-Strauss, sarcoidosis. Consider autoimmune mediated interstitial infiltrates given her joint discomfort, swelling, rash but prior w/up neg -HIV NR, rheumatoid factor positive at 19.3. Ace normal; auto-immune panel negative. HSP panel, IgE pending. CBC w/ dfiff w/out eosinophils  P: -continue to wean Heated HFNC fio2 for sats >90%; try to maintain higher flows and wean fio2 before weaning flow. -BiPAP prn -OOB; PT/OT as tolerated -IS and pulm toiletry -follow up skin biopsy -follow up skin pathology -follow up urine legionella   -continue Bactrum -continue steroids  Pneumomediastinum.  Likely due to paroxysmal hard coughing.  No evidence of pneumothorax on chest CT P: -continue cough suppression  Mild hyperglycemia P: -SSI and CBG monitoring    Anxiety from respiratory failure P: -xanax prn  Constipation P: -Bowel regimen    Best Practice (right click and "Reselect all SmartList Selections" daily)   Diet/type: full liquids  DVT prophylaxis: LMWH GI prophylaxis: H2B Lines: N/A Foley:  N/A Code Status:  full code Last date of multidisciplinary goals of care discussion [mother and patient updated at bedside 7/20 ]  My cct 35 minutes  JD Anselm Lis  Pulmonary & Critical Care 05/25/2021, 7:04 AM  Please see [email or web address] for pager details.  From 7A-7P if no response, please call [phone]. After hours, please call ELink [phone].

## 2021-05-26 ENCOUNTER — Inpatient Hospital Stay (HOSPITAL_COMMUNITY): Payer: 59

## 2021-05-26 DIAGNOSIS — Q211 Atrial septal defect: Secondary | ICD-10-CM

## 2021-05-26 DIAGNOSIS — J982 Interstitial emphysema: Secondary | ICD-10-CM | POA: Diagnosis not present

## 2021-05-26 DIAGNOSIS — E43 Unspecified severe protein-calorie malnutrition: Secondary | ICD-10-CM | POA: Insufficient documentation

## 2021-05-26 DIAGNOSIS — J9601 Acute respiratory failure with hypoxia: Secondary | ICD-10-CM | POA: Diagnosis not present

## 2021-05-26 DIAGNOSIS — R55 Syncope and collapse: Secondary | ICD-10-CM

## 2021-05-26 LAB — HYPERSENSITIVITY PNEUMONITIS
A. Pullulans Abs: NEGATIVE
A.Fumigatus #1 Abs: NEGATIVE
Micropolyspora faeni, IgG: NEGATIVE
Pigeon Serum Abs: NEGATIVE
Thermoact. Saccharii: NEGATIVE
Thermoactinomyces vulgaris, IgG: NEGATIVE

## 2021-05-26 LAB — ECHOCARDIOGRAM COMPLETE BUBBLE STUDY
Area-P 1/2: 3.65 cm2
Calc EF: 67.8 %
S' Lateral: 1.8 cm
Single Plane A2C EF: 64.6 %
Single Plane A4C EF: 69.8 %

## 2021-05-26 LAB — GLUCOSE, CAPILLARY
Glucose-Capillary: 147 mg/dL — ABNORMAL HIGH (ref 70–99)
Glucose-Capillary: 157 mg/dL — ABNORMAL HIGH (ref 70–99)
Glucose-Capillary: 170 mg/dL — ABNORMAL HIGH (ref 70–99)
Glucose-Capillary: 177 mg/dL — ABNORMAL HIGH (ref 70–99)
Glucose-Capillary: 180 mg/dL — ABNORMAL HIGH (ref 70–99)
Glucose-Capillary: 184 mg/dL — ABNORMAL HIGH (ref 70–99)

## 2021-05-26 LAB — PROTIME-INR
INR: 1 (ref 0.8–1.2)
Prothrombin Time: 13.3 seconds (ref 11.4–15.2)

## 2021-05-26 LAB — HEPATITIS PANEL, ACUTE
HCV Ab: NONREACTIVE
Hep A IgM: NONREACTIVE
Hep B C IgM: NONREACTIVE
Hepatitis B Surface Ag: NONREACTIVE

## 2021-05-26 LAB — ANA W/REFLEX IF POSITIVE: Anti Nuclear Antibody (ANA): NEGATIVE

## 2021-05-26 MED ORDER — PERFLUTREN LIPID MICROSPHERE
1.0000 mL | INTRAVENOUS | Status: AC | PRN
  Administered 2021-05-26: 2 mL via INTRAVENOUS
  Filled 2021-05-26: qty 10

## 2021-05-26 MED ORDER — PERFLUTREN LIPID MICROSPHERE
INTRAVENOUS | Status: AC
  Filled 2021-05-26: qty 10

## 2021-05-26 MED ORDER — STERILE WATER FOR INJECTION IJ SOLN
50.0000 ng/kg/min | INTRAVENOUS | Status: DC
  Filled 2021-05-26: qty 5

## 2021-05-26 MED ORDER — IPRATROPIUM-ALBUTEROL 0.5-2.5 (3) MG/3ML IN SOLN
RESPIRATORY_TRACT | Status: AC
  Administered 2021-05-27: 3 mL via RESPIRATORY_TRACT
  Filled 2021-05-26: qty 3

## 2021-05-26 MED ORDER — METHYLPREDNISOLONE SODIUM SUCC 40 MG IJ SOLR: 40.0000 mg | Freq: Two times a day (BID) | INTRAMUSCULAR | Status: DC

## 2021-05-26 NOTE — Progress Notes (Signed)
Initial Nutrition Assessment  DOCUMENTATION CODES:   Severe malnutrition in context of chronic illness  INTERVENTION:   Ensure Enlive po TID, each supplement provides 350 kcal and 20 grams of protein Magic cup TID with meals, each supplement provides 290 kcal and 9 grams of protein MVI with minerals daily   NUTRITION DIAGNOSIS:   Severe Malnutrition related to chronic illness (complication after COVID) as evidenced by .  GOAL:   Patient will meet greater than or equal to 90% of their needs  MONITOR:   PO intake, Supplement acceptance, Labs, Weight trends, Skin, I & O's  REASON FOR ASSESSMENT:   Rounds    ASSESSMENT:   Patient with PMH significant for GDM, IDA, and COVID infection January 2022. Presents this admission with bilateral pulmonary infiltrates and pneumomediastinum.  Patient endorses appetite started to decline after having COVID in January. It progressively worsened over the last month due to worsening respiratory status. States during this time she consumed bites of meals and occasionally Boost Plus. Prior to this she was eating three meals with good protein sources. Intake this admission has been difficult given SOB but has improved today. Last meal completion charted as 100%. Had two Ensures this am and after lunch. Discussed the importance of protein intake for preservation of lean body mass. Patient willing to continue Ensure.   Patient reports a UBW of 180 lb and a recent weight loss of 20 lb. Records indicate patient weighed 178 lb on 6/11 and 158 lb this admission (11.2% wt loss in one month, significant for time frame).   UOP: 850 ml x 24 hrs   Drips: solumedrol Medications: SS novolog, solumedrol, miralax Labs: CBG 137-196  NUTRITION - FOCUSED PHYSICAL EXAM:  Flowsheet Row Most Recent Value  Orbital Region No depletion  Upper Arm Region No depletion  Thoracic and Lumbar Region Unable to assess  Buccal Region No depletion  Temple Region No  depletion  Clavicle Bone Region No depletion  Clavicle and Acromion Bone Region No depletion  Scapular Bone Region Unable to assess  Dorsal Hand No depletion  Patellar Region Mild depletion  Anterior Thigh Region Mild depletion  Posterior Calf Region Mild depletion  Edema (RD Assessment) Mild  Hair Reviewed  Eyes Reviewed  Mouth Reviewed  Skin Reviewed  Nails Reviewed      Diet Order:   Diet Order             Diet regular Room service appropriate? Yes; Fluid consistency: Thin  Diet effective now                   EDUCATION NEEDS:   Education needs have been addressed  Skin:  Skin Assessment: Reviewed RN Assessment  Last BM:  7/22  Height:   Ht Readings from Last 1 Encounters:  05/21/21 5\' 2"  (1.575 m)    Weight:   Wt Readings from Last 1 Encounters:  05/26/21 74 kg    BMI:  Body mass index is 29.84 kg/m.  Estimated Nutritional Needs:   Kcal:  1800-2000 kcal  Protein:  95-115 grams  Fluid:  >/= 1.8 L/day   05/12/2021 MS, RD, LDN, CNSC Clinical Nutrition Pager listed in AMION

## 2021-05-26 NOTE — Progress Notes (Signed)
  Echocardiogram 2D Echocardiogram has been performed.  Gerda Diss 05/26/2021, 3:43 PM

## 2021-05-26 NOTE — Progress Notes (Signed)
Echo confirms pulmonary shunting explaining symptoms and degree of O2 need.  Liver function tests remain mildly elevated.  Last time normal 07/06/2019  Ultimately will probably need more extensive liver workup including portal pressure measurements, RHC with shunt run, potential liver biopsy.  She remains on the [facility] MICU list, currently 3rd in line.  Do not want to start aggressive diagnostics here and have to repeat them at [facility].  For now, supportive care, wean steroids to 1mg /kg/day after 3 day burst, unclear how much her parenchymal disease is contributing.  Lay supine if more SOB.  MD PCCM

## 2021-05-26 NOTE — Progress Notes (Signed)
PT Cancellation Note  Patient Details Name: Natasha Hardy MRN: [id number] DOB: 02/11/1986   Cancelled Treatment:    Reason Eval/Treat Not Completed: Patient not medically ready (pt remains unstable for mobility at this time and noted plans for [facility] transfer. will hold at this time)   Natasha Hardy 05/26/2021, 8:59 AM Merryl Hacker, PT Acute Rehabilitation Services Pager: [phone] Office: [phone]

## 2021-05-26 NOTE — Progress Notes (Signed)
NAME:  Natasha Hardy, MRN:  [id number], DOB:  September 09, 1986, LOS: 5 ADMISSION DATE:  05/21/2021, CONSULTATION DATE: 05/21/2021 REFERRING MD: Dr. Stevie Kern, I MTS, CHIEF COMPLAINT: Abnormal CT chest, pneumomediastinum  History of Present Illness:  35 year old woman, never smoker, with past medical history.  She began to have edema, joint pain as early as April 2022.  Evaluated for progressive dyspnea June 2 and found to have abnormal CT chest with bilateral patchy groundglass pulmonary infiltrates with a relative peripheral distribution including the apices and both bases which were more consolidated.  Her COVID-19 was negative.  She was treated with antibiotics without significant improvement and was reevaluated 04/16/2021.  Another CT chest showed progression of bilateral patchy groundglass infiltrates again in a peripheral pattern with a slight basilar gradient, associated with prominent mediastinal nodes.  She was again treated unsuccessfully with antibiotics.  She was admitted in [facility] 6/20 through 6/24, repeat CT there again showed persistent patchy peripheral consolidation and groundglass with progression.  Her COVID 19 testing was negative on each occasion.  She received empiric IV antibiotics as well as IV corticosteroids, transitioned to prednisone and tapered.  Presented with progressive exertional SOB, paroxysmal cough that is mostly non-productive. No N/V or dry heaves.    Pertinent  Medical History  Gestational diabetes Iron deficiency anemia LE arthralgias and swelling, associated with rash, distal digital petechiae - negative Rheum eval per her report.  COVID positive Jan 2022  Significant Hospital Events: Including procedures, antibiotic start and stop dates in addition to other pertinent events   Multiple CT scans of the chest since 04/06/2021 as an outpatient and then when admitted in [facility] with bilateral pulmonary infiltrates Echocardiogram (HP) 04/26/21 >> LVEF 65-70%,  normal LV size, normal RV size and function 7/17: admitted. Steroids and abx started. Noting had improved somewhat on steroids in past but symptoms got worse again when tapered down to 20mg .  PCCM consulted. We felt likely non-infectious etiology. COP, eosinophilic granulomatosis or Churg-Strauss, sarcoidosis. Consider autoimmune mediated interstitial infiltrates. Autoimmune panel, HSP panel sent. Oxygen needs worse. Required BIPAP requiring Benzos for anxiety.  7/18 HIV NR. In ICU. Skin bx obtained  7/19 Much better. Off CPAP/BIPAP all night. RF +. Diet added. OOB orders placed; Dc'd zosyn and azithromycin; started Bactrim 7/20: on heated HFNC 80-100%; required a few hours of BiPAP after mobilizing and desating to 70s. 7/21: patient still having episodes of severe desaturation down to 70s when mobilizing to side of bed. On 100% heated HFNC and NRB most of day. [facility] Transplant team has agreed to accept this patient to their hospital for further evaluation and possible transplant.   Interim History / Subjective:  Patient yesterday was not able to mobilize without her desating to the 70s on NRB and 100% heated HFNC. Did not tolerate wearing BiPAP.  Overnight slept well on Heated HFNC on 100% 30 l/m.   Objective   Blood pressure 120/70, pulse 99, temperature (!) 97.1 F (36.2 C), temperature source Axillary, resp. rate (!) 22, height 5\' 2"  (1.575 m), weight 74 kg, SpO2 92 %.    FiO2 (%):  [90 %-100 %] 100 %   Intake/Output Summary (Last 24 hours) at 05/26/2021 0721 Last data filed at 05/26/2021 0600 Gross per 24 hour  Intake 508 ml  Output 850 ml  Net -342 ml    Filed Weights   05/24/21 0355 05/25/21 0500 05/26/21 0400  Weight: 72.7 kg 71.7 kg 74 kg    Examination: General:  NAD  HEENT: MM pink/moist; On HHFNC Neuro: Aox4 CV: s1s2, RRR, no m/r/g PULM:  dim BS bilaterally; On heated HFNC 100% 30 l/m with sats in mid 90s GI: soft, bsx4 active Extremities: warm/dry,  no edema  Skin: no rashes or lesions   Urine legionella negative  Resolved Hospital Problem list     Assessment & Plan:   Acute hypoxemic respiratory failure due to bilateral pulmonary infiltrates.  Acute pneumonitis vs flare of undiagnosed ILD. 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 Sarcoid is possible, IPAF. No known risk factors for HP. Infection unlikely- multiple negative PCT, had been on steroids <1 month PTA so OI less likely. RF+, CCP negative suggests RA-ILD is less likely, but remains possible. P: -weaned fio2 to 90% and 30 l/m on heated HFNC -continue to wean Heated HFNC fio2 for sats >88%; try to maintain higher flows and wean fio2 before weaning flow. -BiPAP prn -Limit mobility due to severe desaturations -IS and pulm toiletry -continue Bactrum -continue steroids -continue scheduled duonebs  Pneumomediastinum.  Likely due to paroxysmal hard coughing.  No evidence of pneumothorax on chest CT P: -continue cough suppression  Mild hyperglycemia P: -SSI and CBG monitoring    Anxiety from respiratory failure P: -xanax prn  Constipation P: -Bowel regimen    Best Practice (right click and "Reselect all SmartList Selections" daily)   Diet/type: full liquids  DVT prophylaxis: LMWH GI prophylaxis: H2B Lines: N/A Foley:  N/A Code Status:  full code Last date of multidisciplinary goals of care discussion [mother and patient updated at bedside 7/20 ]  My cct 35 minutes  JD Anselm Lis [place] [facility] Critical Care 05/26/2021, 7:21 AM  Please see [email or web address] for pager details.  From 7A-7P if no response, please call [phone]. After hours, please call ELink [phone].

## 2021-05-26 NOTE — Progress Notes (Signed)
Inhaled epoprostenol (VELETRI) Monitoring:  Current dose:  50 ng/kg/min and 5.01 mL/hr IBW: 50.1 kg Duration of each syringe: ~10 hours First syringe (date/time): 7/22 12:30-22:30  Next syringe due (date/time): 7/22 22:30 Backup on unit: Yes  Current plan:  Continue  Plan to transfer patient out to [facility] later today. Inhaled epo to help with stability during transfer.

## 2021-05-27 ENCOUNTER — Inpatient Hospital Stay (HOSPITAL_COMMUNITY): Payer: 59

## 2021-05-27 DIAGNOSIS — J80 Acute respiratory distress syndrome: Secondary | ICD-10-CM | POA: Diagnosis not present

## 2021-05-27 LAB — COMPREHENSIVE METABOLIC PANEL
ALT: 74 U/L — ABNORMAL HIGH (ref 0–44)
AST: 50 U/L — ABNORMAL HIGH (ref 15–41)
Albumin: 2.2 g/dL — ABNORMAL LOW (ref 3.5–5.0)
Alkaline Phosphatase: 61 U/L (ref 38–126)
Anion gap: 6 (ref 5–15)
BUN: 17 mg/dL (ref 6–20)
CO2: 29 mmol/L (ref 22–32)
Calcium: 8.7 mg/dL — ABNORMAL LOW (ref 8.9–10.3)
Chloride: 101 mmol/L (ref 98–111)
Creatinine, Ser: 0.52 mg/dL (ref 0.44–1.00)
GFR, Estimated: >60 mL/min (ref >60)
Glucose, Bld: 154 mg/dL — ABNORMAL HIGH (ref 70–99)
Potassium: 4.5 mmol/L (ref 3.5–5.1)
Sodium: 136 mmol/L (ref 135–145)
Total Bilirubin: 0.5 mg/dL (ref 0.3–1.2)
Total Protein: 5.6 g/dL — ABNORMAL LOW (ref 6.5–8.1)

## 2021-05-27 LAB — CBC
HCT: 38.7 % (ref 36.0–46.0)
Hemoglobin: 12.6 g/dL (ref 12.0–15.0)
MCH: 29 pg (ref 26.0–34.0)
MCHC: 32.6 g/dL (ref 30.0–36.0)
MCV: 89 fL (ref 80.0–100.0)
Platelets: 241 10*3/uL (ref 150–400)
RBC: 4.35 MIL/uL (ref 3.87–5.11)
RDW: 12.4 % (ref 11.5–15.5)
WBC: 7.4 10*3/uL (ref 4.0–10.5)
nRBC: 0 % (ref 0.0–0.2)

## 2021-05-27 LAB — POCT I-STAT 7, (LYTES, BLD GAS, ICA,H+H)
Acid-Base Excess: 8 mmol/L — ABNORMAL HIGH (ref 0.0–2.0)
Acid-Base Excess: 8 mmol/L — ABNORMAL HIGH (ref 0.0–2.0)
Bicarbonate: 35 mmol/L — ABNORMAL HIGH (ref 20.0–28.0)
Bicarbonate: 36.8 mmol/L — ABNORMAL HIGH (ref 20.0–28.0)
Calcium, Ion: 1.25 mmol/L (ref 1.15–1.40)
Calcium, Ion: 1.24 mmol/L (ref 1.15–1.40)
HCT: 44 % (ref 36.0–46.0)
HCT: 47 % — ABNORMAL HIGH (ref 36.0–46.0)
Hemoglobin: 15 g/dL (ref 12.0–15.0)
Hemoglobin: 16 g/dL — ABNORMAL HIGH (ref 12.0–15.0)
O2 Saturation: 81 %
O2 Saturation: 87 %
Patient temperature: 98.5
Patient temperature: 38.2
Potassium: 4.4 mmol/L (ref 3.5–5.1)
Potassium: 4.6 mmol/L (ref 3.5–5.1)
Sodium: 138 mmol/L (ref 135–145)
Sodium: 138 mmol/L (ref 135–145)
TCO2: 37 mmol/L — ABNORMAL HIGH (ref 22–32)
TCO2: 39 mmol/L — ABNORMAL HIGH (ref 22–32)
pCO2 arterial: 56.7 mmHg — ABNORMAL HIGH (ref 32.0–48.0)
pCO2 arterial: 69.4 mmHg (ref 32.0–48.0)
pH, Arterial: 7.398 (ref 7.350–7.450)
pH, Arterial: 7.338 — ABNORMAL LOW (ref 7.350–7.450)
pO2, Arterial: 46 mmHg — ABNORMAL LOW (ref 83.0–108.0)
pO2, Arterial: 64 mmHg — ABNORMAL LOW (ref 83.0–108.0)

## 2021-05-27 LAB — GLUCOSE, CAPILLARY
Glucose-Capillary: 137 mg/dL — ABNORMAL HIGH (ref 70–99)
Glucose-Capillary: 160 mg/dL — ABNORMAL HIGH (ref 70–99)
Glucose-Capillary: 186 mg/dL — ABNORMAL HIGH (ref 70–99)
Glucose-Capillary: 187 mg/dL — ABNORMAL HIGH (ref 70–99)
Glucose-Capillary: 195 mg/dL — ABNORMAL HIGH (ref 70–99)

## 2021-05-27 LAB — HEPARIN LEVEL (UNFRACTIONATED)
Heparin Unfractionated: >1.1 IU/mL — ABNORMAL HIGH (ref 0.30–0.70)
Heparin Unfractionated: >1.1 IU/mL — ABNORMAL HIGH (ref 0.30–0.70)

## 2021-05-27 MED ORDER — FENTANYL BOLUS VIA INFUSION
50.0000 ug | INTRAVENOUS | Status: DC | PRN
  Administered 2021-05-28 (×2): 100 ug via INTRAVENOUS
  Filled 2021-05-27: qty 100

## 2021-05-27 MED ORDER — CHLORHEXIDINE GLUCONATE 0.12% ORAL RINSE (MEDLINE KIT): 15.0000 mL | Freq: Two times a day (BID) | OROMUCOSAL | 0 refills | Status: AC

## 2021-05-27 MED ORDER — ROCURONIUM BROMIDE 10 MG/ML (PF) SYRINGE
PREFILLED_SYRINGE | INTRAVENOUS | Status: AC
  Filled 2021-05-27: qty 10

## 2021-05-27 MED ORDER — PANTOPRAZOLE SODIUM 40 MG IV SOLR: 40.0000 mg | Freq: Every day | INTRAVENOUS | Status: AC

## 2021-05-27 MED ORDER — SULFAMETHOXAZOLE-TRIMETHOPRIM 800-160 MG PO TABS: 1.0000 | ORAL_TABLET | ORAL | Status: AC

## 2021-05-27 MED ORDER — ONDANSETRON HCL 4 MG/2ML IJ SOLN: 4.0000 mg | Freq: Four times a day (QID) | INTRAMUSCULAR | 0 refills | Status: AC | PRN

## 2021-05-27 MED ORDER — MIDAZOLAM HCL 2 MG/2ML IJ SOLN: 2.0000 mg | Freq: Once | INTRAMUSCULAR | Status: AC

## 2021-05-27 MED ORDER — FENTANYL BOLUS VIA INFUSION: 50.0000 ug | INTRAVENOUS | 0 refills | Status: AC | PRN

## 2021-05-27 MED ORDER — PHENYLEPHRINE 40 MCG/ML (10ML) SYRINGE FOR IV PUSH (FOR BLOOD PRESSURE SUPPORT)
PREFILLED_SYRINGE | INTRAVENOUS | Status: AC
  Administered 2021-05-27: 200 ug
  Filled 2021-05-27: qty 20

## 2021-05-27 MED ORDER — DOCUSATE SODIUM 50 MG/5ML PO LIQD: 100.0000 mg | Freq: Two times a day (BID) | ORAL | 0 refills | Status: AC

## 2021-05-27 MED ORDER — CHLORHEXIDINE GLUCONATE CLOTH 2 % EX PADS: 6.0000 | MEDICATED_PAD | Freq: Every day | CUTANEOUS | Status: AC

## 2021-05-27 MED ORDER — VECURONIUM BROMIDE 10 MG IV SOLR
0.0000 ug/kg/min | INTRAVENOUS | Status: DC
Start: 2021-05-27 — End: 2021-05-28
  Administered 2021-05-27: 1 ug/kg/min via INTRAVENOUS
  Filled 2021-05-27: qty 100

## 2021-05-27 MED ORDER — ROCURONIUM BROMIDE 50 MG/5ML IV SOLN
60.0000 mg | Freq: Once | INTRAVENOUS | Status: AC
  Filled 2021-05-27: qty 6

## 2021-05-27 MED ORDER — KETAMINE HCL 50 MG/5ML IJ SOSY
PREFILLED_SYRINGE | INTRAMUSCULAR | Status: AC
  Filled 2021-05-27: qty 5

## 2021-05-27 MED ORDER — ALPRAZOLAM 0.5 MG PO TABS: 0.5000 mg | ORAL_TABLET | Freq: Three times a day (TID) | ORAL | Status: DC | PRN

## 2021-05-27 MED ORDER — ARTIFICIAL TEARS OPHTHALMIC OINT
1.0000 "application " | TOPICAL_OINTMENT | Freq: Three times a day (TID) | OPHTHALMIC | Status: DC
  Administered 2021-05-27: 1 via OPHTHALMIC
  Filled 2021-05-27: qty 3.5

## 2021-05-27 MED ORDER — HEPARIN (PORCINE) 25000 UT/250ML-% IV SOLN
650.0000 [IU]/h | INTRAVENOUS | Status: DC
  Administered 2021-05-27: 900 [IU]/h via INTRAVENOUS
  Filled 2021-05-27: qty 250

## 2021-05-27 MED ORDER — ENSURE ENLIVE PO LIQD: 237.0000 mL | Freq: Three times a day (TID) | ORAL | 12 refills | Status: AC

## 2021-05-27 MED ORDER — CHLORHEXIDINE GLUCONATE 0.12 % MT SOLN: 15.0000 mL | Freq: Two times a day (BID) | OROMUCOSAL | 0 refills | Status: AC

## 2021-05-27 MED ORDER — SODIUM CHLORIDE 0.9 % IV SOLN: 10.0000 mL | INTRAVENOUS | 0 refills | Status: AC | PRN

## 2021-05-27 MED ORDER — NOREPINEPHRINE 4 MG/250ML-% IV SOLN
INTRAVENOUS | Status: AC
  Administered 2021-05-27: 4 mg
  Filled 2021-05-27: qty 250

## 2021-05-27 MED ORDER — CHLORHEXIDINE GLUCONATE 0.12 % MT SOLN
OROMUCOSAL | Status: AC
  Administered 2021-05-27: 15 mL
  Filled 2021-05-27: qty 15

## 2021-05-27 MED ORDER — POLYETHYLENE GLYCOL 3350 17 G PO PACK: 17.0000 g | PACK | Freq: Every day | ORAL | Status: DC

## 2021-05-27 MED ORDER — FAMOTIDINE 40 MG PO TABS: 40.0000 mg | ORAL_TABLET | Freq: Every day | ORAL | Status: AC

## 2021-05-27 MED ORDER — VECURONIUM BROMIDE 10 MG IV SOLR: 0.0000 ug/kg/min | Status: AC

## 2021-05-27 MED ORDER — LORAZEPAM 2 MG/ML IJ SOLN
2.0000 mg | Freq: Once | INTRAMUSCULAR | Status: AC
  Administered 2021-05-27: 2 mg via INTRAVENOUS
  Filled 2021-05-27: qty 1

## 2021-05-27 MED ORDER — INSULIN ASPART 100 UNIT/ML IJ SOLN: 1.0000 [IU] | INTRAMUSCULAR | 11 refills | Status: AC

## 2021-05-27 MED ORDER — CHLORHEXIDINE GLUCONATE 0.12% ORAL RINSE (MEDLINE KIT)
15.0000 mL | Freq: Two times a day (BID) | OROMUCOSAL | Status: DC
  Administered 2021-05-27: 15 mL via OROMUCOSAL

## 2021-05-27 MED ORDER — HEPARIN (PORCINE) 25000 UT/250ML-% IV SOLN: 900.0000 [IU]/h | INTRAVENOUS | Status: AC

## 2021-05-27 MED ORDER — ADULT MULTIVITAMIN LIQUID CH: 15.0000 mL | Freq: Every day | ORAL | Status: DC

## 2021-05-27 MED ORDER — SULFAMETHOXAZOLE-TRIMETHOPRIM 800-160 MG PO TABS: 1.0000 | ORAL_TABLET | ORAL | Status: DC

## 2021-05-27 MED ORDER — PROPOFOL 1000 MG/100ML IV EMUL: 0.0000 ug/kg/min | INTRAVENOUS | Status: AC

## 2021-05-27 MED ORDER — NOREPINEPHRINE 4 MG/250ML-% IV SOLN
0.0000 ug/min | INTRAVENOUS | Status: DC
  Administered 2021-05-28: 15 ug/min via INTRAVENOUS
  Filled 2021-05-27: qty 250

## 2021-05-27 MED ORDER — ACETAMINOPHEN 325 MG PO TABS: 650.0000 mg | ORAL_TABLET | Freq: Four times a day (QID) | ORAL | Status: DC | PRN

## 2021-05-27 MED ORDER — FENTANYL 2500MCG IN NS 250ML (10MCG/ML) PREMIX INFUSION
50.0000 ug/h | INTRAVENOUS | Status: DC
  Administered 2021-05-27: 100 ug/h via INTRAVENOUS
  Filled 2021-05-27: qty 250

## 2021-05-27 MED ORDER — ORAL CARE MOUTH RINSE
15.0000 mL | OROMUCOSAL | Status: DC
  Administered 2021-05-27 – 2021-05-28 (×3): 15 mL via OROMUCOSAL

## 2021-05-27 MED ORDER — ACETAMINOPHEN 325 MG PO TABS: 650.0000 mg | ORAL_TABLET | Freq: Four times a day (QID) | ORAL | Status: AC | PRN

## 2021-05-27 MED ORDER — ARTIFICIAL TEARS OPHTHALMIC OINT: 1.0000 "application " | TOPICAL_OINTMENT | Freq: Three times a day (TID) | OPHTHALMIC | Status: AC

## 2021-05-27 MED ORDER — LORAZEPAM 2 MG/ML IJ SOLN
INTRAMUSCULAR | Status: AC
  Administered 2021-05-27: 2 mg via INTRAVENOUS
  Filled 2021-05-27: qty 1

## 2021-05-27 MED ORDER — DOCUSATE SODIUM 50 MG/5ML PO LIQD: 100.0000 mg | Freq: Two times a day (BID) | ORAL | Status: DC

## 2021-05-27 MED ORDER — ROCURONIUM BROMIDE 50 MG/5ML IV SOLN
60.0000 mg | Freq: Once | INTRAVENOUS | Status: AC
  Administered 2021-05-27: 60 mg via INTRAVENOUS
  Filled 2021-05-27: qty 6

## 2021-05-27 MED ORDER — PROPOFOL 1000 MG/100ML IV EMUL
0.0000 ug/kg/min | INTRAVENOUS | Status: DC
  Administered 2021-05-27: 20 ug/kg/min via INTRAVENOUS
  Administered 2021-05-27 – 2021-05-28 (×2): 50 ug/kg/min via INTRAVENOUS
  Filled 2021-05-27 (×2): qty 100

## 2021-05-27 MED ORDER — ETOMIDATE 2 MG/ML IV SOLN: 20.0000 mg | Freq: Once | INTRAVENOUS | Status: AC

## 2021-05-27 MED ORDER — ORAL CARE MOUTH RINSE: 15.0000 mL | Freq: Two times a day (BID) | OROMUCOSAL | 0 refills | Status: AC

## 2021-05-27 MED ORDER — HYDROCOD POLST-CPM POLST ER 10-8 MG/5ML PO SUER: 5.0000 mL | Freq: Two times a day (BID) | ORAL | Status: DC

## 2021-05-27 MED ORDER — PANTOPRAZOLE SODIUM 40 MG IV SOLR
40.0000 mg | Freq: Every day | INTRAVENOUS | Status: DC
  Administered 2021-05-27: 40 mg via INTRAVENOUS
  Filled 2021-05-27: qty 40

## 2021-05-27 MED ORDER — LACTATED RINGERS IV BOLUS
500.0000 mL | Freq: Once | INTRAVENOUS | Status: AC
  Administered 2021-05-28: 500 mL via INTRAVENOUS

## 2021-05-27 MED ORDER — METHYLPREDNISOLONE SODIUM SUCC 40 MG IJ SOLR: 40.0000 mg | Freq: Two times a day (BID) | INTRAMUSCULAR | 0 refills | Status: AC

## 2021-05-27 MED ORDER — SODIUM CHLORIDE 0.9 % IV SOLN: INTRAVENOUS | Status: DC | PRN

## 2021-05-27 MED ORDER — FUROSEMIDE 10 MG/ML IJ SOLN
40.0000 mg | Freq: Four times a day (QID) | INTRAMUSCULAR | Status: AC
  Administered 2021-05-27 (×2): 40 mg via INTRAVENOUS
  Filled 2021-05-27 (×2): qty 4

## 2021-05-27 MED ORDER — HYDROCOD POLST-CPM POLST ER 10-8 MG/5ML PO SUER: 5.0000 mL | Freq: Two times a day (BID) | ORAL | 0 refills | Status: AC

## 2021-05-27 MED ORDER — NOREPINEPHRINE 4 MG/250ML-% IV SOLN: 0.0000 ug/min | INTRAVENOUS | Status: AC

## 2021-05-27 MED ORDER — ROCURONIUM BROMIDE 10 MG/ML (PF) SYRINGE
PREFILLED_SYRINGE | INTRAVENOUS | Status: AC
  Administered 2021-05-27: 100 mg via INTRAVENOUS
  Filled 2021-05-27: qty 10

## 2021-05-27 MED ORDER — ETOMIDATE 2 MG/ML IV SOLN
INTRAVENOUS | Status: AC
  Administered 2021-05-27: 20 mg via INTRAVENOUS
  Filled 2021-05-27: qty 20

## 2021-05-27 MED ORDER — FENTANYL CITRATE (PF) 100 MCG/2ML IJ SOLN: 50.0000 ug | Freq: Once | INTRAMUSCULAR | Status: AC

## 2021-05-27 MED ORDER — POLYETHYLENE GLYCOL 3350 17 G PO PACK: 17.0000 g | PACK | Freq: Every day | ORAL | 0 refills | Status: AC

## 2021-05-27 MED ORDER — VECURONIUM BOLUS VIA INFUSION
10.0000 mg | Freq: Once | INTRAVENOUS | Status: AC
  Administered 2021-05-27: 10 mg via INTRAVENOUS
  Filled 2021-05-27: qty 10

## 2021-05-27 MED ORDER — STERILE WATER FOR INJECTION IJ SOLN
0.0000 ng/kg/min | INTRAVENOUS | Status: DC
  Administered 2021-05-27: 50 ng/kg/min via RESPIRATORY_TRACT
  Administered 2021-05-28: 30 ng/kg/min via RESPIRATORY_TRACT
  Filled 2021-05-27 (×2): qty 5

## 2021-05-27 MED ORDER — FENTANYL CITRATE (PF) 100 MCG/2ML IJ SOLN: 100.0000 ug | Freq: Once | INTRAMUSCULAR | Status: AC

## 2021-05-27 MED ORDER — FENTANYL CITRATE (PF) 100 MCG/2ML IJ SOLN
INTRAMUSCULAR | Status: AC
  Administered 2021-05-27: 100 ug via INTRAVENOUS
  Filled 2021-05-27: qty 2

## 2021-05-27 MED ORDER — MIDAZOLAM HCL 2 MG/2ML IJ SOLN
INTRAMUSCULAR | Status: AC
  Administered 2021-05-27: 2 mg via INTRAVENOUS
  Filled 2021-05-27: qty 2

## 2021-05-27 MED ORDER — ACETAMINOPHEN 650 MG RE SUPP
650.0000 mg | Freq: Four times a day (QID) | RECTAL | Status: DC | PRN
  Administered 2021-05-27: 650 mg via RECTAL
  Filled 2021-05-27: qty 1

## 2021-05-27 MED ORDER — HEPARIN BOLUS VIA INFUSION
4000.0000 [IU] | Freq: Once | INTRAVENOUS | Status: AC
  Administered 2021-05-27: 4000 [IU] via INTRAVENOUS
  Filled 2021-05-27: qty 4000

## 2021-05-27 MED ORDER — ADULT MULTIVITAMIN LIQUID CH: 15.0000 mL | Freq: Every day | ORAL | Status: AC

## 2021-05-27 MED ORDER — FENTANYL 2500MCG IN NS 250ML (10MCG/ML) PREMIX INFUSION: 50.0000 ug/h | INTRAVENOUS | 0 refills | Status: AC

## 2021-05-27 MED ORDER — LORAZEPAM 2 MG/ML IJ SOLN: 2.0000 mg | Freq: Once | INTRAMUSCULAR | Status: AC

## 2021-05-27 NOTE — Progress Notes (Addendum)
[facility] MICU attending reviewing records.  Do not think we would buy much with VV ECMO but defer to their input.  If refused or delayed transfer would attempt proning.  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  Does not appear volume overloaded on exam or by echo, gave lasix as salvage.  Ultimately progressed to intubation today. On 6cc/kg TV, Dps 16cmH2O. Given mild pneumomediastinum limit PEEP/DP as able Mild pupillary difference noted after paralytic so going for head CT.  Not much effect to addition of inhaled pulmonary vasodilator.  Systemic would be interesting but would need to be monitored closely.  Family (mother, father, fiance) has been updated at length and are willing to do VV ECMO PRN, want everything done to tryto save her life.  She remains young, in single organ failure.  Additional 60 minutes critical care time spent coordinating care.  Myrla Halsted MD PCCM

## 2021-05-27 NOTE — Procedures (Signed)
Central Venous Catheter Insertion Procedure Note  Natasha Hardy  [id number]  05-16-1986  Date:05/27/21  Time:5:40 PM   Provider Performing:Shireen Rayburn Erby Pian   Procedure: Insertion of Non-tunneled Central Venous Catheter(36556) without US guidance  Indication(s) Medication administration  Consent Risks of the procedure as well as the alternatives and risks of each were explained to the patient and/or caregiver.  Consent for the procedure was obtained and is signed in the bedside chart  Anesthesia Topical only with 1% lidocaine   Timeout Verified patient identification, verified procedure, site/side was marked, verified correct patient position, special equipment/implants available, medications/allergies/relevant history reviewed, required imaging and test results available.  Sterile Technique Maximal sterile technique including full sterile barrier drape, hand hygiene, sterile gown, sterile gloves, mask, hair covering, sterile ultrasound probe cover (if used).  Procedure Description Area of catheter insertion was cleaned with chlorhexidine and draped in sterile fashion.  Without real-time ultrasound guidance a central venous catheter was placed into the left subclavian vein. Nonpulsatile blood flow and easy flushing noted in all ports.  The catheter was sutured in place and sterile dressing applied.  Complications/Tolerance None; patient tolerated the procedure well. Chest X-ray is ordered to verify placement for internal jugular or subclavian cannulation.   Chest x-ray is not ordered for femoral cannulation.  EBL Minimal  Specimen(s) None

## 2021-05-27 NOTE — Progress Notes (Signed)
ANTICOAGULATION CONSULT NOTE - Initial Consult  Pharmacy Consult for heparin Indication:  high clot risk  No Known Allergies  Patient Measurements: Height: 5\' 2"  (157.5 cm) Weight: 75.2 kg (165 lb 12.6 oz) IBW/kg (Calculated) : 50.1 Heparin Dosing Weight: 66.8  Vital Signs: Temp: 97.3 F (36.3 C) (07/23 0841) Temp Source: Axillary (07/23 0841) BP: 118/81 (07/23 0600) Pulse Rate: 91 (07/23 0807)  Labs: Recent Labs    05/25/21 0047 05/25/21 2239 05/26/21 1444 05/27/21 0212  HGB 12.6 14.3  --  12.6  HCT 39.7 42.0  --  38.7  PLT 306  --   --  241  LABPROT  --   --  13.3  --   INR  --   --  1.0  --   CREATININE 0.59  --   --  0.52    Estimated Creatinine Clearance: 93.1 mL/min (by C-G formula based on SCr of 0.52 mg/dL).   Medical History: History reviewed. No pertinent past medical history.  Medications:  Infusions:   Assessment: 35 yo F with high clot risk d/t autoimmune disease Patient has been on LMWH ppx @ 40 mg daily (last dose 7/22 @ 1330) Bolus dose to start since no AC in almost 24h Hgb 14.3>>12.6, Plts 306 >> 241, watch  Goal of Therapy:  Heparin level 0.3-0.5 units/ml (lower goal d/t no systemic clot) Monitor platelets by anticoagulation protocol: Yes   Plan:  Give 4000 units bolus x 1 Start heparin infusion at 900 units/hr Continue to monitor H&H and platelets  Greyson Riccardi A Kiffany Schelling 05/27/2021,9:51 AM

## 2021-05-27 NOTE — Evaluation (Addendum)
Physical Therapy Evaluation Patient Details Name: Natasha Hardy MRN: [id number] DOB: Jun 04, 1986 Today's Date: 05/27/2021   History of Present Illness  35 yo admitted 7/17 with acute respiratory failure due to acute inflammatory process. Admitted to [facility] 6/20-6/24 for respiratory failure. PMhx: anemia  Clinical Impression  Pt pleasant with dad present throughout session. Pt supine throughout session per Dr.Smith request for assessment of tolerance in supine for HEP. Pt with SpO2 88% with desaturation to 83% with heel slides on left. Pt requested bed pan use with further desaturation to 80% and HR 140 with RR 25. Pt with prolonged recovery of 8 min to achieve 83% SpO2 with mod cues for breathing technique but unable to recover further with HR 125 and activity ceased with RN and MD aware. Pt with very tenous respiratory status and at this time unable to tolerate even limited movement in supine. Pt with decreased mobility and pulmonary function who will benefit from PT acutely pending improved medical stability. Will follow from a distance for trial of therapy. Pt and dad educated for supine HEP as pt able to recover and tolerate.    Follow Up Recommendations LTACH    Equipment Recommendations  None recommended by PT    Recommendations for Other Services       Precautions / Restrictions Precautions Precautions: Other (comment) Precaution Comments: watch sats fast desaturation without recovery      Mobility  Bed Mobility Overal bed mobility: Needs Assistance Bed Mobility: Rolling Rolling: Min guard;Min assist         General bed mobility comments: rolling to right x 4 for use of bed pan with min and minguard assist. Max assist to slide toward HOB. Unable to further mobilize due to desaturation    Transfers                    Ambulation/Gait                Stairs            Wheelchair Mobility    Modified Rankin (Stroke Patients Only)        Balance                                             Pertinent Vitals/Pain Pain Assessment: 0-10 Pain Score: 3  Pain Location: chest Pain Descriptors / Indicators: Aching;Sore Pain Intervention(s): Limited activity within patient's tolerance    Home Living Family/patient expects to be discharged to:: Private residence Living Arrangements: Children Available Help at Discharge: Family;Available 24 hours/day Type of Home: House                Prior Function Level of Independence: Independent         Comments: pt has 35yo twins and 35 yo. Works in [employer]        Extremity/Trunk Assessment   Upper Extremity Assessment Upper Extremity Assessment: Generalized weakness    Lower Extremity Assessment Lower Extremity Assessment: Generalized weakness       Communication      Cognition Arousal/Alertness: Awake/alert Behavior During Therapy: Flat affect                                   General Comments: limited verbalizations due to respiratory distress, appropriate responses.  Dad present and able to provide PLOF      General Comments      Exercises General Exercises - Lower Extremity Heel Slides: AROM;Left;Supine;10 reps   Assessment/Plan    PT Assessment Patient needs continued PT services  PT Problem List Decreased strength;Decreased mobility;Decreased activity tolerance;Cardiopulmonary status limiting activity       PT Treatment Interventions Functional mobility training;Therapeutic activities;Patient/family education;Therapeutic exercise    PT Goals (Current goals can be found in the Care Plan section)  Acute Rehab PT Goals Patient Stated Goal: return home PT Goal Formulation: With patient/family Time For Goal Achievement: 06/10/21 Potential to Achieve Goals: Fair    Frequency Min 2X/week (trial pending respiratory stability)   Barriers to discharge        Co-evaluation                AM-PAC PT "6 Clicks" Mobility  Outcome Measure Help needed turning from your back to your side while in a flat bed without using bedrails?: A Little Help needed moving from lying on your back to sitting on the side of a flat bed without using bedrails?: A Lot Help needed moving to and from a bed to a chair (including a wheelchair)?: Total Help needed standing up from a chair using your arms (e.g., wheelchair or bedside chair)?: Total Help needed to walk in hospital room?: Total Help needed climbing 3-5 steps with a railing? : Total 6 Click Score: 9    End of Session   Activity Tolerance: Treatment limited secondary to medical complications (Comment) Patient left: in bed;with family/visitor present;with nursing/sitter in room Nurse Communication: Mobility status PT Visit Diagnosis: Muscle weakness (generalized) (M62.81)    Time: 6967-8938 PT Time Calculation (min) (ACUTE ONLY): 31 min   Charges:   PT Evaluation $PT Eval Moderate Complexity: 1 Mod          Zaeden Lastinger P, PT Acute Rehabilitation Services Pager: [phone] Office: [phone]   Azariah Latendresse B Kiauna Zywicki 05/27/2021, 1:06 PM

## 2021-05-27 NOTE — Progress Notes (Signed)
Pt unresponsive parents at the bedside. They told me a little about what was going on and that this pt has three children. The parents were tearful yet appropriate.  The chaplain offered caring and supportive presence, prayers and blessings. He informed the parents that they can call at anytime for the chaplain.

## 2021-05-27 NOTE — Progress Notes (Addendum)
NAME:  Natasha Hardy, MRN:  [id number], DOB:  04-23-1986, LOS: 6 ADMISSION DATE:  05/21/2021, CONSULTATION DATE: 05/21/2021 REFERRING MD: Dr. Stevie Kern, I MTS, CHIEF COMPLAINT: Abnormal CT chest, pneumomediastinum  History of Present Illness:  35 year old woman, never smoker, with past medical history.  She began to have edema, joint pain as early as April 2022.  Evaluated for progressive dyspnea June 2 and found to have abnormal CT chest with bilateral patchy groundglass pulmonary infiltrates with a relative peripheral distribution including the apices and both bases which were more consolidated.  Her COVID-19 was negative.  She was treated with antibiotics without significant improvement and was reevaluated 04/16/2021.  Another CT chest showed progression of bilateral patchy groundglass infiltrates again in a peripheral pattern with a slight basilar gradient, associated with prominent mediastinal nodes.  She was again treated unsuccessfully with antibiotics.  She was admitted in [facility] 6/20 through 6/24, repeat CT there again showed persistent patchy peripheral consolidation and groundglass with progression.  Her COVID 19 testing was negative on each occasion.  She received empiric IV antibiotics as well as IV corticosteroids, transitioned to prednisone and tapered.  Presented with progressive exertional SOB, paroxysmal cough that is mostly non-productive. No N/V or dry heaves.    Pertinent  Medical History  Gestational diabetes Iron deficiency anemia LE arthralgias and swelling, associated with rash, distal digital petechiae - negative Rheum eval per her report.  COVID positive Jan 2022  Significant Hospital Events: Including procedures, antibiotic start and stop dates in addition to other pertinent events   Multiple CT scans of the chest since 04/06/2021 as an outpatient and then when admitted in [facility] [place] with bilateral pulmonary infiltrates Echocardiogram (HP) 04/26/21 >>  LVEF 65-70%, normal LV size, normal RV size and function 7/17: admitted. Steroids and abx started. Noting had improved somewhat on steroids in past but symptoms got worse again when tapered down to 20mg .  PCCM consulted. We felt likely non-infectious etiology. COP, eosinophilic granulomatosis or Churg-Strauss, sarcoidosis. Consider autoimmune mediated interstitial infiltrates. Autoimmune panel, HSP panel sent. Oxygen needs worse. Required BIPAP requiring Benzos for anxiety.  7/18 HIV NR. In ICU. Skin bx obtained  7/19 Much better. Off CPAP/BIPAP all night. RF +. Diet added. OOB orders placed; Dc'd zosyn and azithromycin; started Bactrim 7/20: on heated HFNC 80-100%; required a few hours of BiPAP after mobilizing and desating to 70s. 7/21: patient still having episodes of severe desaturation down to 70s when mobilizing to side of bed. On 100% heated HFNC and NRB most of day. [facility] Transplant team has agreed to accept this patient to their hospital for further evaluation and possible transplant.   Interim History / Subjective:  No events. Poor appetite. Family at bedside.  Objective   Blood pressure 118/81, pulse 91, temperature (!) 97.3 F (36.3 C), temperature source Axillary, resp. rate 19, height 5\' 2"  (1.575 m), weight 75.2 kg, SpO2 (!) 89 %.    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 Diffuse AVMs, CTA less impressive for  latter.  There was intially reports that others in family had this but I misheard, there is no family history of orthodeoxia.  Mild desquamating chest rash- path read as postinflammatory hyperpigmentation Pneumomediastinum- related to ILD and coughing fit Mild transaminitis- unclear origin, would probably benefit from RHC with portal pressure gradient measurements.  Worsened CXR appearance I suspect related to poor mobility and inspiratory efforts, developing third spacing from poor PO.   - Solumedrol to 40mg  BID - Continue bactrim for PCP ppx - Wean O2 as able - Encourage IS, PO - Full dose AC given risk of paradoxical embolism - Family updated at length - Still awaiting bed at [facility], not going to get into more aggressive workup including TEE, RHC, pulmonary vasodilator challeneg here since she will need these as part of transplant evaluation.  Ultimately transplant may be the only option here.  34 min cc time not including any separately billable procedures [facility] MD PCCM      Printed following for patient and family education  "Normally blood flows from your heart into your lungs and gets oxygen from the air you breathe.  When there is lung damage or abnormal blood vessels in the lungs, the blood from your heart can go through the lungs and never touch the air.  This is called a shunt.  When this happens, your body gets a portion of the blood without oxygen (called a shunt fraction).  When you sit up, the shunt fraction goes up and leads to worsened oxygen.  When you lay down, the blood is distributed to parts of the lung that are more normal so your oxygen level goes down.  The cause of all of this is either damage to the lung and/or abnormal remodeling of lung blood vessels.  We are trying to treat the damage to the lung with steroids.  The remodeling of the blood vessels we are giving time to see if this improves.  Ultimately there are a couple more studies that we need to  do to try to figure out how best to manage this long term.  I do not want to do these studies here and put you through the risks of the procedures since ultimately treatment will be at [facility].   I am calling daily to try to emphasize the importance of you getting there.  Your job is to relax, use your incentive spirometer, and keep up food intake. "

## 2021-05-27 NOTE — Procedures (Signed)
Intubation Procedure Note  Natasha Hardy  [id number]  1985/12/20  Date:05/27/21  Time:5:39 PM   Provider Performing:Annslee Tercero C Katrinka Blazing    Procedure: Intubation (31500)  Indication(s) Respiratory Failure  Consent Risks of the procedure as well as the alternatives and risks of each were explained to the patient and/or caregiver.  Consent for the procedure was obtained and is signed in the bedside chart   Anesthesia Etomidate, Versed, Fentanyl, and Rocuronium   Time Out Verified patient identification, verified procedure, site/side was marked, verified correct patient position, special equipment/implants available, medications/allergies/relevant history reviewed, required imaging and test results available.   Sterile Technique Usual hand hygeine, masks, and gloves were used   Procedure Description Patient positioned in bed supine.  Sedation given as noted above.  Patient was intubated with endotracheal tube using Glidescope.  View was Grade 1 full glottis .  Number of attempts was 1.  Colorimetric CO2 detector was consistent with tracheal placement.   Complications/Tolerance None; patient tolerated the procedure well. Chest X-ray is ordered to verify placement.   EBL Minimal   Specimen(s) None

## 2021-05-27 NOTE — Progress Notes (Signed)
Called to bedside by primary RN and pharmacist.  Notified that [facility] Ventilator was incompatible with veletri inhaled solution.  Discussed situation with Dr. Katrinka Blazing and patients mother Natasha Hardy. Plan per Dr. Katrinka Blazing is to wean the veletri over two hours and then transfer to [facility]. Plan to stop wean of Veletri if O2 saturations drop and sustain below 80%. If this happens will continue Veletri overnight and re-evaluate plan.   Discussed risks and benefits regarding weaning the drug for transfer with the patients mother Natasha Hardy. Ms. Natasha Hardy was ok with the plan to wean the drug in attempt for transfer to [facility].   Gershon Mussel., MSN, APRN, AGACNP-BC [facility]  06/11/21 , 12:07 AM  Please see [email or web address] for pager details  If no response, please call [phone] After hours, please call Elink at [phone]

## 2021-05-27 NOTE — Progress Notes (Signed)
ANTICOAGULATION CONSULT NOTE - Follow-Up Consult  Pharmacy Consult for heparin Indication:  high clot risk  No Known Allergies  Patient Measurements: Height: 5\' 2"  (157.5 cm) Weight: 75.2 kg (165 lb 12.6 oz) IBW/kg (Calculated) : 50.1 Heparin Dosing Weight: 66.8  Vital Signs: Temp: 101.5 F (38.6 C) (07/23 2215) BP: 91/57 (07/23 2215) Pulse Rate: 143 (07/23 2214)  Labs: Recent Labs    05/25/21 0047 05/25/21 2239 05/26/21 1444 05/27/21 0212 05/27/21 1457 05/27/21 1754 05/27/21 2005 05/27/21 2135  HGB 12.6   < >  --  12.6  --  15.0 16.0*  --   HCT 39.7   < >  --  38.7  --  44.0 47.0*  --   PLT 306  --   --  241  --   --   --   --   LABPROT  --   --  13.3  --   --   --   --   --   INR  --   --  1.0  --   --   --   --   --   HEPARINUNFRC  --   --   --   --  >1.10*  --   --  >1.10*  CREATININE 0.59  --   --  0.52  --   --   --   --    < > = values in this interval not displayed.     Estimated Creatinine Clearance: 93.1 mL/min (by C-G formula based on SCr of 0.52 mg/dL).   Medical History: History reviewed. No pertinent past medical history.  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 CCM requesting pharmacy to dose Heparin.   HL this evening SUPRAtherapeutic (>1.1, confirmed). CBC okay - no bleeding noted.   Goal of Therapy:  Heparin level 0.3-0.5 units/ml (lower goal d/t no systemic clot) Monitor platelets by anticoagulation protocol: Yes   Plan:  - Decrease Heparin to 650 units/hr (6.5  ml/hr) - Patient transferring to [facility] - will have anticoag followed up there, likely transitioning to ECMO  Thank you for allowing pharmacy to be a part of this patient's care.  05/29/21, PharmD, BCPS Clinical Pharmacist Clinical phone for 05/27/2021: [phone] 05/27/2021 10:40 PM   **Pharmacist phone directory can now be found on [email or web address] ([email or web address]).  Listed under [facility].

## 2021-05-27 NOTE — Progress Notes (Signed)
ETT withdrawn 2 cm per physician order.  ETT secured at 23 cm H20.

## 2021-05-27 NOTE — Progress Notes (Signed)
Inhaled epoprostenol (VELETRI) Monitoring:  Current dose:  50 ng/kg/min and 5.01 mL/hr IBW: 50.1 kg Duration of each syringe: ~10 hours First syringe (date/time): 7/23 1800 - 0400  Next syringe due (date/time): 7/24 0400 Backup on unit: Yes  Current plan:  Continue   Discussed with RT and RN about no abrupt stopping, back-up syringe in pyxis fridge. Expect change out around 0400.   Thank you for allowing pharmacy to be a part of this patient's care.  Georgina Pillion, PharmD, BCPS Clinical Pharmacist Clinical phone for 05/27/2021: [phone] 05/27/2021 6:28 PM   **Pharmacist phone directory can now be found on [email or web address] ([email or web address]).  Listed under [facility].

## 2021-05-27 NOTE — Progress Notes (Signed)
Progressive deterioration through day. Checked on multiple times, met with parents multiple times. Now having severe tachypnea, tachycardia somewhat relieved with PRN anxiolytics. I think she is forcing our hand with intubation. Should this not go well I think we need to proceed with VV ECMO.  Additional 65 minutes cc time not including any procedures Erskine Emery MD PCCM

## 2021-05-28 MED ORDER — LACTATED RINGERS IV BOLUS: 500.0000 mL | Freq: Once | INTRAVENOUS | Status: DC

## 2021-05-28 MED ORDER — LACTATED RINGERS IV BOLUS
500.0000 mL | Freq: Once | INTRAVENOUS | Status: AC
  Administered 2021-05-28: 500 mL via INTRAVENOUS

## 2021-06-01 ENCOUNTER — Institutional Professional Consult (permissible substitution): Payer: 59 | Admitting: Pulmonary Disease

## 2021-06-05 NOTE — Progress Notes (Signed)
Epoprostenol turned off after rapid wean per MD, PT seems to be tolerating well at this time, RT will continue to monitor.

## 2021-06-05 NOTE — Progress Notes (Signed)
Escorted patient's mom along with patient and [facility] RNs from [facility] to their truck.

## 2021-06-05 DEATH — deceased

## 2021-06-12 LAB — CULTURE, FUNGUS WITHOUT SMEAR: Special Requests: NORMAL

## 2022-05-03 IMAGING — CT CT CHEST W/O CM
2 of 4 series · 15 of 36 positions shown, 18 images · non-contrast
Comparison: CT 04/06/2021, radiograph 04/16/2021

CLINICAL DATA: Respiratory illness, nondiagnostic radiograph,
shortness of breath for 1 week, worsening this morning

EXAM:
CT CHEST WITHOUT CONTRAST
TECHNIQUE: Multidetector CT imaging of the chest was performed following the
standard protocol without IV contrast.

[Series 2: thorax · axial · 0.67mm/px · z∈[-158,+88]mm · 12 of 143 slices shown, 15 images]
[im 10/143  mediastinal]
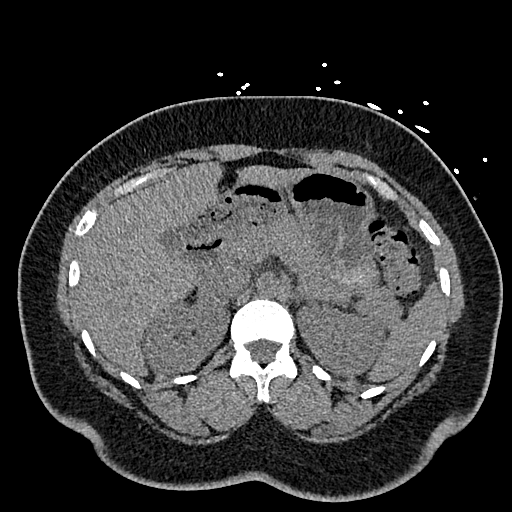
[im 10/143  lung]
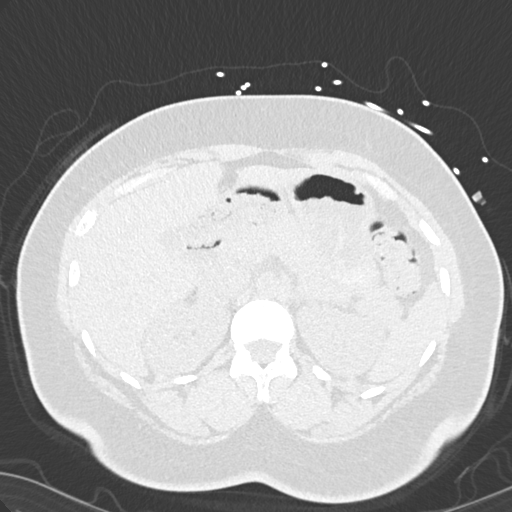
[im 19/143  lung]
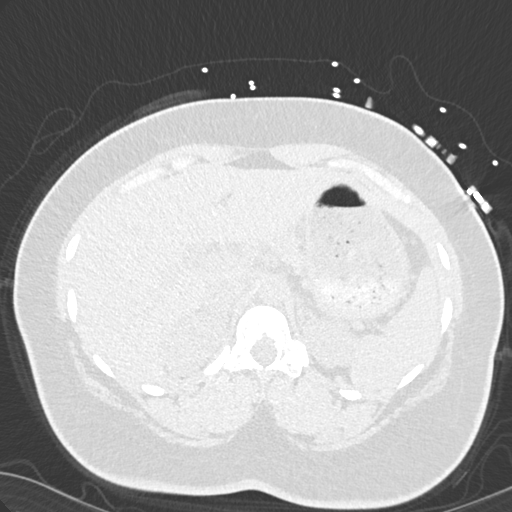
[im 29/143  lung]
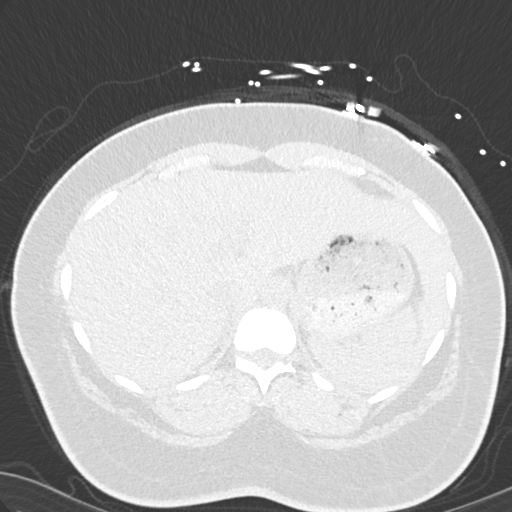
[im 48/143  lung]
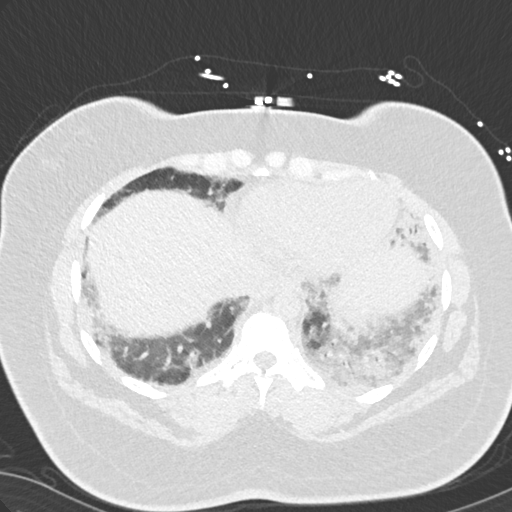
[im 57/143  mediastinal]
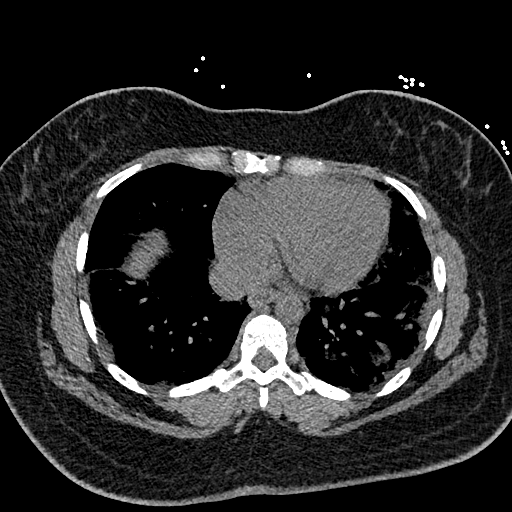
[im 57/143  lung]
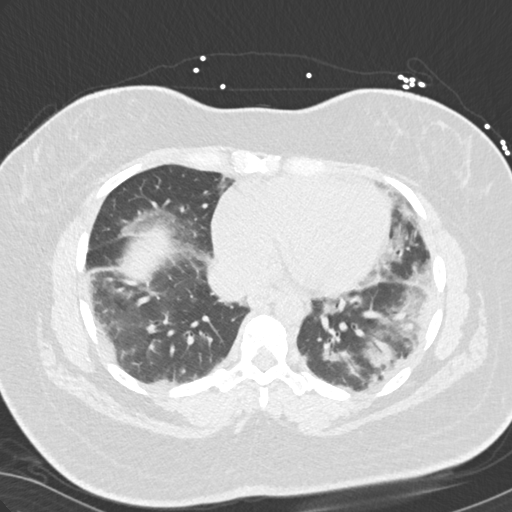
[im 67/143  lung]
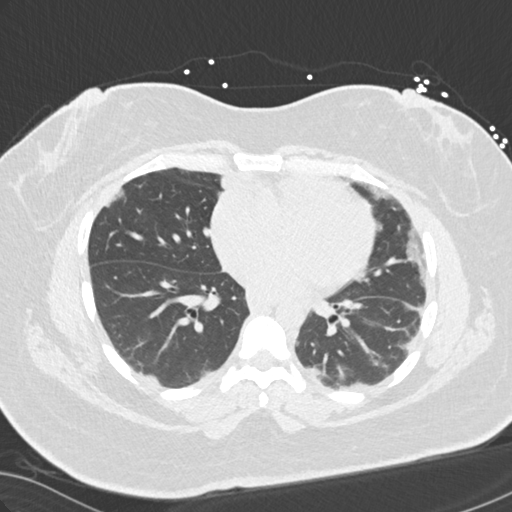
[im 76/143  lung]
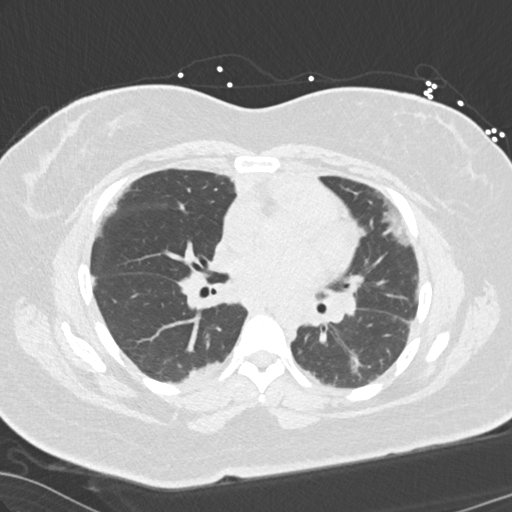
[im 86/143  lung]
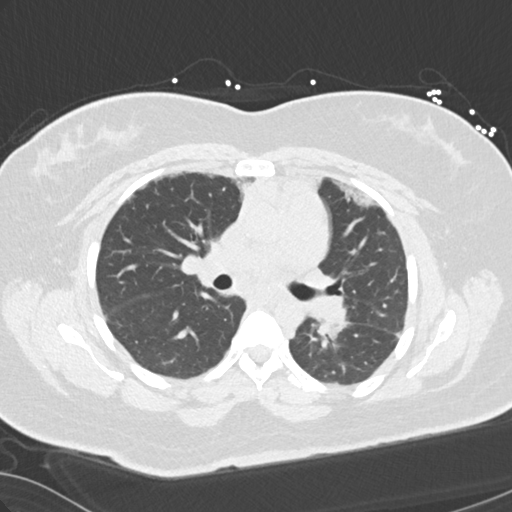
[im 95/143  mediastinal]
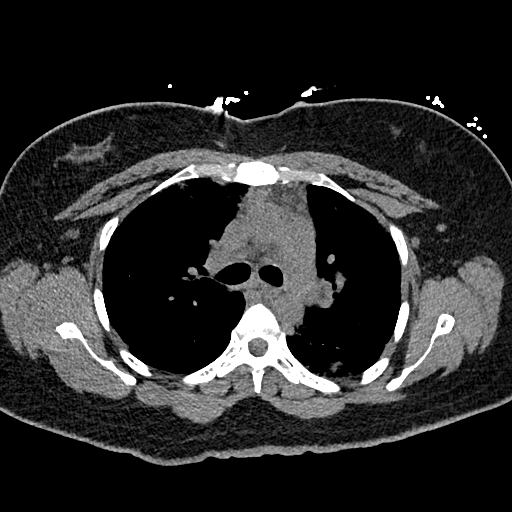
[im 95/143  lung]
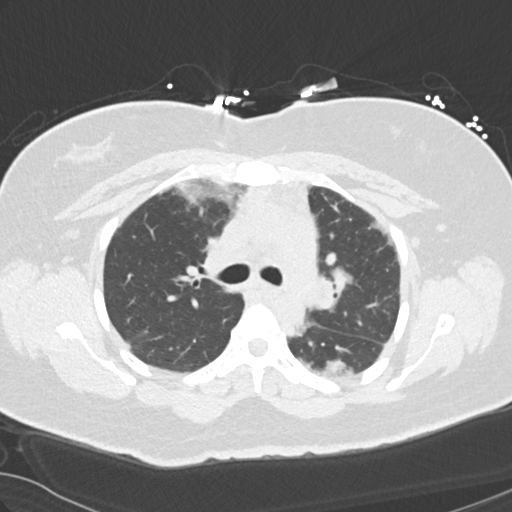
[im 114/143  lung]
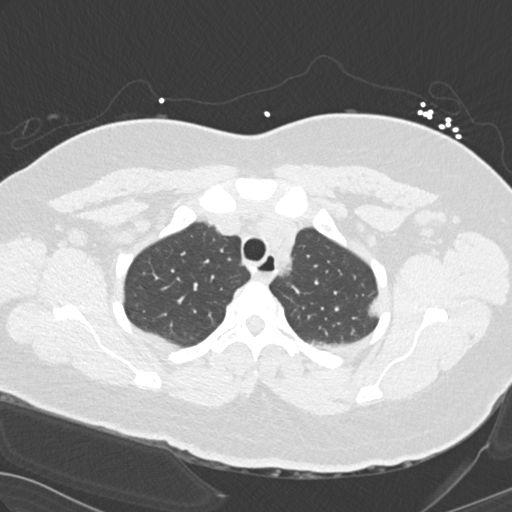
[im 124/143  lung]
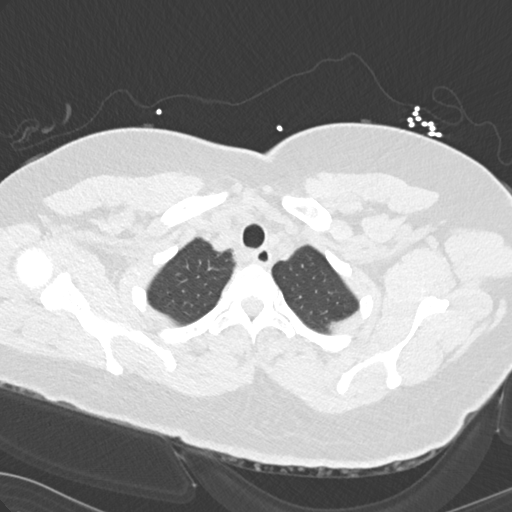
[im 133/143  lung]
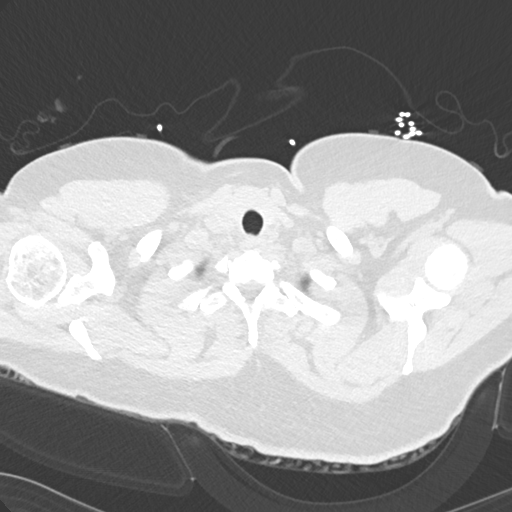

[Series 5: coronal · coronal · 0.59mm/px · 3 of 150 slices shown]
[im 30/150  lung]
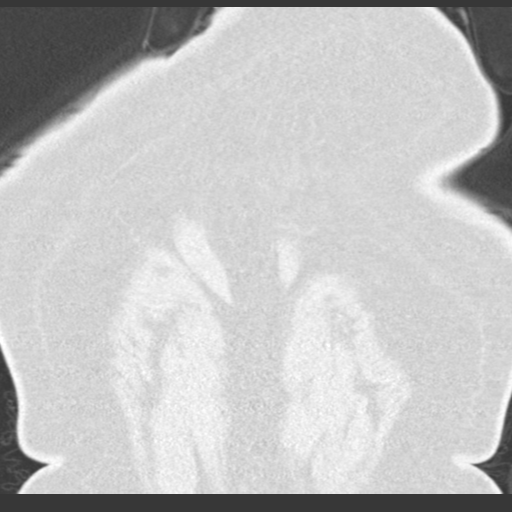
[im 60/150  lung]
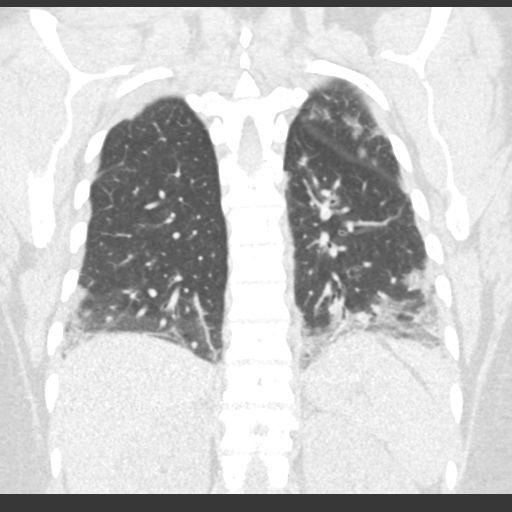
[im 90/150  lung]
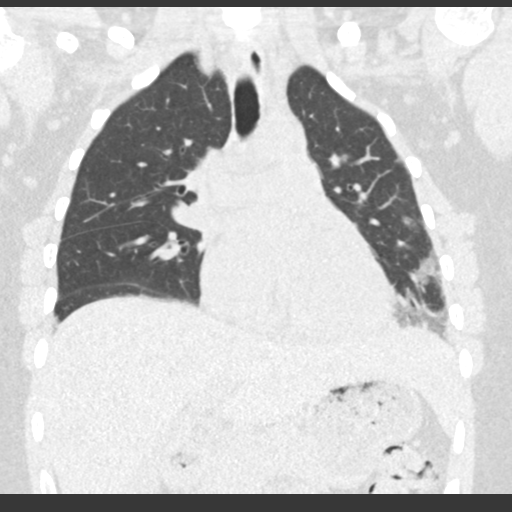

[15 of 36 positions shown; findings below may reference images not displayed]

FINDINGS: Cardiovascular: Normal heart size. No pericardial effusion. No acute
luminal abnormality of the imaged aorta. No periaortic stranding or
hemorrhage. Normal 3 vessel branching of the aortic arch. Proximal
great vessels are unremarkable. Central pulmonary arteries are
normal caliber. Luminal evaluation of the vasculature precluded in
the absence of contrast media. No major venous abnormalities are
seen.

Mediastinum/Nodes: Prominent prevascular, AP window and bilateral
axillary lymph nodes are similar to prior and favored to be
reactive. No pathologically enlarged nodes. No acute abnormality of
the trachea or esophagus. Normal thyroid gland and thoracic inlet.

Lungs/Pleura: Increasing patchy peripheral predominant areas of
mixed consolidation and ground-glass opacity throughout both lungs,
left greater than right, with a slight basilar gradient. No
pneumothorax or visible pleural effusion. Pulmonary vascularity
remains normally distributed. No significant septal thickening or
other CT evidence pulmonary edema.

Upper Abdomen: No acute abnormalities present in the visualized
portions of the upper abdomen.

Musculoskeletal: Multilevel degenerative changes are present in the
imaged portions of the spine. No acute osseous abnormality or
suspicious osseous lesion. No worrisome chest wall masses or
lesions.
IMPRESSION: 1. Increasing patchy peripheral predominant areas of mixed
consolidation and ground-glass opacity throughout both lungs
differential consideration would including multifocal pneumonia as
well as other atypical infectious or inflammatory processes
including [id number] pneumonia.
2. Prominent mediastinal and axillary adenopathy is favored to be
reactive.

## 2022-06-07 IMAGING — DX DG CHEST 1V PORT
1 series · 1 of 1 positions shown · non-contrast
Comparison: Chest x-ray 04/27/2021.

CLINICAL DATA: 35-year-old female with history of shortness of
breath.

EXAM:
PORTABLE CHEST 1 VIEW

[chest]
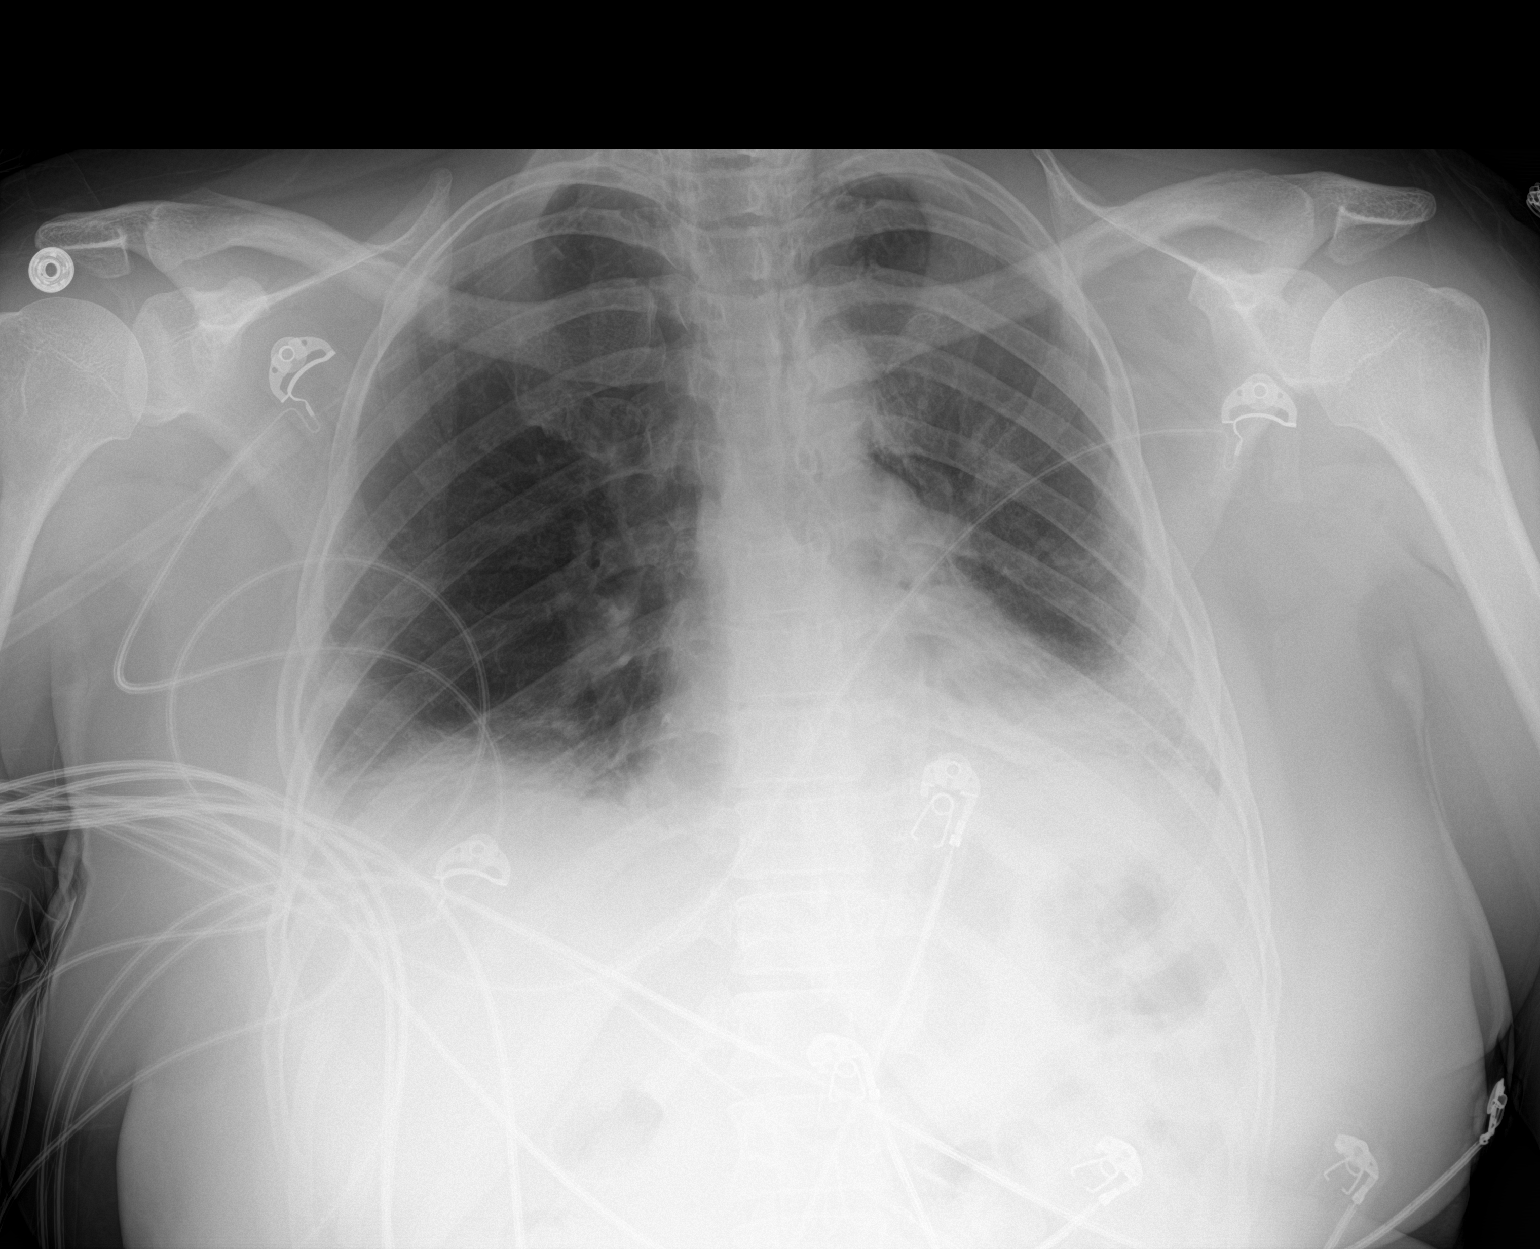

[1 of 1 positions shown; findings below may reference images not displayed]

FINDINGS: Lung volumes are low. There continues to be some ill-defined
opacities throughout the mid to lower lungs bilaterally (left
greater than right), compatible with residual areas of airspace
consolidation. Possible trace bilateral pleural effusions. No
pneumothorax. No definite suspicious appearing pulmonary nodules or
masses. Heart size is normal. Upper mediastinal contours are within
normal limits.
IMPRESSION: 1. Low lung volumes with persistent bilateral multilobar pneumonia.
Possible trace bilateral pleural effusions. Given the longevity of
these findings on prior chest radiographs, the possibility of
developing post infectious fibrosis warrants consideration.
Outpatient referral to Pulmonology for further clinical evaluation
is suggested. Follow-up nonemergent high-resolution chest CT should
be considered in the next 1-2 months to reassess the appearance of
the lung parenchyma.
